# Patient Record
Sex: Female | Born: 1990
Health system: Southern US, Community
[De-identification: ages and names within clinical notes are randomized; demographics above are authoritative.]

## PROBLEM LIST (undated history)

## (undated) ENCOUNTER — Inpatient Hospital Stay (HOSPITAL_COMMUNITY): Payer: Self-pay

## (undated) DIAGNOSIS — K831 Obstruction of bile duct: Secondary | ICD-10-CM

## (undated) DIAGNOSIS — O24419 Gestational diabetes mellitus in pregnancy, unspecified control: Secondary | ICD-10-CM

## (undated) DIAGNOSIS — I8390 Asymptomatic varicose veins of unspecified lower extremity: Secondary | ICD-10-CM

## (undated) DIAGNOSIS — O0990 Supervision of high risk pregnancy, unspecified, unspecified trimester: Secondary | ICD-10-CM

## (undated) DIAGNOSIS — F329 Major depressive disorder, single episode, unspecified: Secondary | ICD-10-CM

## (undated) DIAGNOSIS — K8020 Calculus of gallbladder without cholecystitis without obstruction: Secondary | ICD-10-CM

## (undated) DIAGNOSIS — O26613 Liver and biliary tract disorders in pregnancy, third trimester: Secondary | ICD-10-CM

## (undated) DIAGNOSIS — F32A Depression, unspecified: Secondary | ICD-10-CM

## (undated) DIAGNOSIS — Z8619 Personal history of other infectious and parasitic diseases: Secondary | ICD-10-CM

## (undated) DIAGNOSIS — O210 Mild hyperemesis gravidarum: Secondary | ICD-10-CM

## (undated) DIAGNOSIS — E669 Obesity, unspecified: Secondary | ICD-10-CM

## (undated) DIAGNOSIS — E66811 Obesity, class 1: Secondary | ICD-10-CM

## (undated) HISTORY — DX: Major depressive disorder, single episode, unspecified: F32.9

## (undated) HISTORY — DX: Depression, unspecified: F32.A

## (undated) HISTORY — DX: Mild hyperemesis gravidarum: O21.0

## (undated) HISTORY — DX: Supervision of high risk pregnancy, unspecified, unspecified trimester: O09.90

## (undated) HISTORY — DX: Personal history of other infectious and parasitic diseases: Z86.19

## (undated) HISTORY — DX: Gestational diabetes mellitus in pregnancy, unspecified control: O24.419

## (undated) HISTORY — DX: Obesity, unspecified: E66.9

## (undated) HISTORY — DX: Liver and biliary tract disorders in pregnancy, third trimester: O26.613

## (undated) HISTORY — DX: Obesity, class 1: E66.811

## (undated) HISTORY — DX: Obstruction of bile duct: K83.1

## (undated) HISTORY — DX: Liver and biliary tract disorders in pregnancy, third trimester: K80.20

## (undated) HISTORY — PX: NO PAST SURGERIES: SHX2092

## (undated) HISTORY — DX: Asymptomatic varicose veins of unspecified lower extremity: I83.90

---
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
## 2009-03-12 ENCOUNTER — Inpatient Hospital Stay (HOSPITAL_COMMUNITY): Admission: AD | Admit: 2009-03-12 | Discharge: 2009-03-14 | Payer: Self-pay | Admitting: Obstetrics & Gynecology

## 2009-03-12 ENCOUNTER — Ambulatory Visit: Payer: Self-pay | Admitting: Physician Assistant

## 2010-10-09 LAB — CBC
HCT: 38 % (ref 36.0–46.0)
Hemoglobin: 12.8 g/dL (ref 12.0–15.0)
RBC: 4.27 MIL/uL (ref 3.87–5.11)
RDW: 14.7 % (ref 11.5–15.5)

## 2010-10-15 LAB — URINALYSIS, ROUTINE W REFLEX MICROSCOPIC
Glucose, UA: >1000 mg/dL — AB
Leukocytes, UA: NEGATIVE
Nitrite: NEGATIVE
Specific Gravity, Urine: 1.033 — ABNORMAL HIGH (ref 1.005–1.030)
pH: 6 (ref 5.0–8.0)

## 2010-10-15 LAB — URINE MICROSCOPIC-ADD ON

## 2010-10-15 LAB — POCT I-STAT, CHEM 8
BUN: <3 mg/dL — ABNORMAL LOW (ref 6–23)
Calcium, Ion: 1.2 mmol/L (ref 1.12–1.32)
Chloride: 101 mEq/L (ref 96–112)
Creatinine, Ser: 0.4 mg/dL (ref 0.4–1.2)
Glucose, Bld: 70 mg/dL (ref 70–99)
TCO2: 24 mmol/L (ref 0–100)

## 2010-10-15 LAB — WET PREP, GENITAL: Yeast Wet Prep HPF POC: NONE SEEN

## 2010-10-15 LAB — ABO/RH: ABO/RH(D): A POS

## 2010-10-15 LAB — GC/CHLAMYDIA PROBE AMP, GENITAL
Chlamydia, DNA Probe: NEGATIVE
GC Probe Amp, Genital: NEGATIVE

## 2010-10-15 LAB — POCT PREGNANCY, URINE: Preg Test, Ur: POSITIVE

## 2012-07-05 NOTE — L&D Delivery Note (Signed)
Delivery Note At 7:15 PM a viable female was delivered via Vaginal, Spontaneous Delivery.  Apgars pending.     Placenta status: Intact, Spontaneous.  Cord: 3 vessels with the following complications: None.    Anesthesia: None  Episiotomy: None Lacerations: 1st degree;Labial Suture Repair: None Est. Blood Loss (mL): 150  Mom to postpartum.  Baby to nursery-stable.  [facility] 12/25/2012, 8:05 PM

## 2012-07-17 LAB — OB RESULTS CONSOLE RUBELLA ANTIBODY, IGM: Rubella: IMMUNE

## 2012-07-17 LAB — OB RESULTS CONSOLE ANTIBODY SCREEN: Antibody Screen: NEGATIVE

## 2012-07-17 LAB — OB RESULTS CONSOLE ABO/RH

## 2012-07-17 LAB — OB RESULTS CONSOLE GC/CHLAMYDIA: Gonorrhea: NEGATIVE

## 2012-08-14 ENCOUNTER — Other Ambulatory Visit (HOSPITAL_COMMUNITY): Payer: Self-pay | Admitting: Nurse Practitioner

## 2012-08-14 DIAGNOSIS — Z3689 Encounter for other specified antenatal screening: Secondary | ICD-10-CM

## 2012-08-17 ENCOUNTER — Ambulatory Visit (HOSPITAL_COMMUNITY)
Admission: RE | Admit: 2012-08-17 | Discharge: 2012-08-17 | Disposition: A | Discharge status: Home / Self Care | Payer: Medicaid Other | Source: Ambulatory Visit | Attending: Nurse Practitioner | Admitting: Nurse Practitioner

## 2012-08-17 ENCOUNTER — Encounter (HOSPITAL_COMMUNITY): Payer: Self-pay

## 2012-08-17 DIAGNOSIS — Z1389 Encounter for screening for other disorder: Secondary | ICD-10-CM | POA: Insufficient documentation

## 2012-08-17 DIAGNOSIS — Z363 Encounter for antenatal screening for malformations: Secondary | ICD-10-CM | POA: Insufficient documentation

## 2012-08-17 DIAGNOSIS — Z3689 Encounter for other specified antenatal screening: Secondary | ICD-10-CM

## 2012-08-17 DIAGNOSIS — O358XX0 Maternal care for other (suspected) fetal abnormality and damage, not applicable or unspecified: Secondary | ICD-10-CM | POA: Insufficient documentation

## 2012-12-15 ENCOUNTER — Other Ambulatory Visit (HOSPITAL_COMMUNITY): Payer: Self-pay | Admitting: Physician Assistant

## 2012-12-15 ENCOUNTER — Ambulatory Visit (HOSPITAL_COMMUNITY)
Admission: RE | Admit: 2012-12-15 | Discharge: 2012-12-15 | Disposition: A | Discharge status: Home / Self Care | Payer: Medicaid Other | Source: Ambulatory Visit | Attending: Physician Assistant | Admitting: Physician Assistant

## 2012-12-15 DIAGNOSIS — K831 Obstruction of bile duct: Secondary | ICD-10-CM

## 2012-12-15 DIAGNOSIS — K838 Other specified diseases of biliary tract: Secondary | ICD-10-CM | POA: Insufficient documentation

## 2012-12-15 DIAGNOSIS — O99891 Other specified diseases and conditions complicating pregnancy: Secondary | ICD-10-CM | POA: Insufficient documentation

## 2012-12-15 DIAGNOSIS — Z3689 Encounter for other specified antenatal screening: Secondary | ICD-10-CM | POA: Insufficient documentation

## 2012-12-18 ENCOUNTER — Encounter (HOSPITAL_COMMUNITY): Payer: Self-pay | Admitting: *Deleted

## 2012-12-18 ENCOUNTER — Telehealth (HOSPITAL_COMMUNITY): Payer: Self-pay | Admitting: *Deleted

## 2012-12-18 NOTE — Telephone Encounter (Signed)
Preadmission screen Interpreter number [id number] 

## 2012-12-20 LAB — OB RESULTS CONSOLE GBS: GBS: POSITIVE

## 2012-12-25 ENCOUNTER — Encounter (HOSPITAL_COMMUNITY): Payer: Self-pay

## 2012-12-25 ENCOUNTER — Encounter (HOSPITAL_COMMUNITY): Payer: Self-pay | Admitting: Anesthesiology

## 2012-12-25 ENCOUNTER — Inpatient Hospital Stay (HOSPITAL_COMMUNITY): Payer: Medicaid Other

## 2012-12-25 ENCOUNTER — Inpatient Hospital Stay (HOSPITAL_COMMUNITY)
Admission: RE | Admit: 2012-12-25 | Discharge: 2012-12-27 | DRG: 775 | Disposition: A | Discharge status: Home / Self Care | Payer: Medicaid Other | Source: Ambulatory Visit | Attending: Family Medicine | Admitting: Family Medicine

## 2012-12-25 VITALS — BP 97/63 | HR 57 | Temp 97.7°F | Resp 18 | Ht 60.0 in | Wt 162.0 lb

## 2012-12-25 DIAGNOSIS — O26619 Liver and biliary tract disorders in pregnancy, unspecified trimester: Principal | ICD-10-CM | POA: Diagnosis present

## 2012-12-25 DIAGNOSIS — E669 Obesity, unspecified: Secondary | ICD-10-CM | POA: Diagnosis present

## 2012-12-25 DIAGNOSIS — O99892 Other specified diseases and conditions complicating childbirth: Secondary | ICD-10-CM | POA: Diagnosis present

## 2012-12-25 DIAGNOSIS — O99214 Obesity complicating childbirth: Secondary | ICD-10-CM

## 2012-12-25 DIAGNOSIS — K838 Other specified diseases of biliary tract: Secondary | ICD-10-CM

## 2012-12-25 DIAGNOSIS — O26649 Intrahepatic cholestasis of pregnancy, unspecified trimester: Secondary | ICD-10-CM

## 2012-12-25 DIAGNOSIS — K831 Obstruction of bile duct: Secondary | ICD-10-CM

## 2012-12-25 DIAGNOSIS — Z22330 Carrier of Group B streptococcus: Secondary | ICD-10-CM

## 2012-12-25 LAB — CBC
HCT: 33.8 % — ABNORMAL LOW (ref 36.0–46.0)
MCV: 84.3 fL (ref 78.0–100.0)
Platelets: 317 10*3/uL (ref 150–400)
RBC: 4.01 MIL/uL (ref 3.87–5.11)
WBC: 10.4 10*3/uL (ref 4.0–10.5)

## 2012-12-25 LAB — TYPE AND SCREEN

## 2012-12-25 MED ORDER — ONDANSETRON HCL 4 MG/2ML IJ SOLN: 4.0000 mg | INTRAMUSCULAR | Status: DC | PRN

## 2012-12-25 MED ORDER — IBUPROFEN 600 MG PO TABS
600.0000 mg | ORAL_TABLET | Freq: Four times a day (QID) | ORAL | Status: DC
  Administered 2012-12-25 – 2012-12-27 (×7): 600 mg via ORAL
  Filled 2012-12-25 (×9): qty 1

## 2012-12-25 MED ORDER — PENICILLIN G POTASSIUM 5000000 UNITS IJ SOLR
5.0000 10*6.[IU] | Freq: Once | INTRAVENOUS | Status: AC
  Administered 2012-12-25: 5 10*6.[IU] via INTRAVENOUS
  Filled 2012-12-25: qty 5

## 2012-12-25 MED ORDER — SIMETHICONE 80 MG PO CHEW: 80.0000 mg | CHEWABLE_TABLET | ORAL | Status: DC | PRN

## 2012-12-25 MED ORDER — ZOLPIDEM TARTRATE 5 MG PO TABS: 5.0000 mg | ORAL_TABLET | Freq: Every evening | ORAL | Status: DC | PRN

## 2012-12-25 MED ORDER — LACTATED RINGERS IV SOLN: INTRAVENOUS | Status: DC

## 2012-12-25 MED ORDER — WITCH HAZEL-GLYCERIN EX PADS: 1.0000 "application " | MEDICATED_PAD | CUTANEOUS | Status: DC | PRN

## 2012-12-25 MED ORDER — PRENATAL MULTIVITAMIN CH
1.0000 | ORAL_TABLET | Freq: Every day | ORAL | Status: DC
  Administered 2012-12-26 – 2012-12-27 (×2): 1 via ORAL
  Filled 2012-12-25 (×2): qty 1

## 2012-12-25 MED ORDER — TERBUTALINE SULFATE 1 MG/ML IJ SOLN: 0.2500 mg | Freq: Once | INTRAMUSCULAR | Status: DC | PRN

## 2012-12-25 MED ORDER — PENICILLIN G POTASSIUM 5000000 UNITS IJ SOLR
2.5000 10*6.[IU] | INTRAVENOUS | Status: DC
  Filled 2012-12-25 (×3): qty 2.5

## 2012-12-25 MED ORDER — LACTATED RINGERS IV SOLN: 500.0000 mL | Freq: Once | INTRAVENOUS | Status: DC

## 2012-12-25 MED ORDER — OXYTOCIN 40 UNITS IN LACTATED RINGERS INFUSION - SIMPLE MED
62.5000 mL/h | INTRAVENOUS | Status: DC
  Filled 2012-12-25: qty 1000

## 2012-12-25 MED ORDER — BENZOCAINE-MENTHOL 20-0.5 % EX AERO
1.0000 "application " | INHALATION_SPRAY | CUTANEOUS | Status: DC | PRN
  Administered 2012-12-26: 1 via TOPICAL
  Filled 2012-12-25: qty 56

## 2012-12-25 MED ORDER — PHENYLEPHRINE 40 MCG/ML (10ML) SYRINGE FOR IV PUSH (FOR BLOOD PRESSURE SUPPORT)
80.0000 ug | PREFILLED_SYRINGE | INTRAVENOUS | Status: DC | PRN
  Filled 2012-12-25: qty 2

## 2012-12-25 MED ORDER — IBUPROFEN 600 MG PO TABS: 600.0000 mg | ORAL_TABLET | Freq: Four times a day (QID) | ORAL | Status: DC | PRN

## 2012-12-25 MED ORDER — SENNOSIDES-DOCUSATE SODIUM 8.6-50 MG PO TABS
2.0000 | ORAL_TABLET | Freq: Every day | ORAL | Status: DC
  Administered 2012-12-25 – 2012-12-26 (×2): 2 via ORAL

## 2012-12-25 MED ORDER — OXYCODONE-ACETAMINOPHEN 5-325 MG PO TABS: 1.0000 | ORAL_TABLET | ORAL | Status: DC | PRN

## 2012-12-25 MED ORDER — ACETAMINOPHEN 325 MG PO TABS: 650.0000 mg | ORAL_TABLET | ORAL | Status: DC | PRN

## 2012-12-25 MED ORDER — DIBUCAINE 1 % RE OINT: 1.0000 "application " | TOPICAL_OINTMENT | RECTAL | Status: DC | PRN

## 2012-12-25 MED ORDER — OXYTOCIN BOLUS FROM INFUSION
500.0000 mL | INTRAVENOUS | Status: DC
  Administered 2012-12-25: 500 mL via INTRAVENOUS

## 2012-12-25 MED ORDER — PHENYLEPHRINE 40 MCG/ML (10ML) SYRINGE FOR IV PUSH (FOR BLOOD PRESSURE SUPPORT)
80.0000 ug | PREFILLED_SYRINGE | INTRAVENOUS | Status: DC | PRN
  Filled 2012-12-25: qty 5
  Filled 2012-12-25: qty 2

## 2012-12-25 MED ORDER — MISOPROSTOL 25 MCG QUARTER TABLET
25.0000 ug | ORAL_TABLET | ORAL | Status: DC
  Administered 2012-12-25: 25 ug via VAGINAL
  Filled 2012-12-25 (×4): qty 1
  Filled 2012-12-25 (×2): qty 0.25

## 2012-12-25 MED ORDER — EPHEDRINE 5 MG/ML INJ
10.0000 mg | INTRAVENOUS | Status: DC | PRN
  Filled 2012-12-25: qty 2

## 2012-12-25 MED ORDER — ONDANSETRON HCL 4 MG PO TABS: 4.0000 mg | ORAL_TABLET | ORAL | Status: DC | PRN

## 2012-12-25 MED ORDER — PENICILLIN G POTASSIUM 5000000 UNITS IJ SOLR
5.0000 10*6.[IU] | Freq: Once | INTRAVENOUS | Status: DC
  Filled 2012-12-25: qty 5

## 2012-12-25 MED ORDER — FENTANYL 2.5 MCG/ML BUPIVACAINE 1/10 % EPIDURAL INFUSION (WH - ANES)
14.0000 mL/h | INTRAMUSCULAR | Status: DC | PRN
  Filled 2012-12-25: qty 125

## 2012-12-25 MED ORDER — DIPHENHYDRAMINE HCL 50 MG/ML IJ SOLN: 12.5000 mg | INTRAMUSCULAR | Status: DC | PRN

## 2012-12-25 MED ORDER — ONDANSETRON HCL 4 MG/2ML IJ SOLN: 4.0000 mg | Freq: Four times a day (QID) | INTRAMUSCULAR | Status: DC | PRN

## 2012-12-25 MED ORDER — LIDOCAINE HCL (PF) 1 % IJ SOLN
30.0000 mL | INTRAMUSCULAR | Status: DC | PRN
  Filled 2012-12-25 (×2): qty 30

## 2012-12-25 MED ORDER — PENICILLIN G POTASSIUM 5000000 UNITS IJ SOLR
2.5000 10*6.[IU] | INTRAMUSCULAR | Status: DC
  Filled 2012-12-25 (×4): qty 2.5

## 2012-12-25 MED ORDER — FLEET ENEMA 7-19 GM/118ML RE ENEM: 1.0000 | ENEMA | RECTAL | Status: DC | PRN

## 2012-12-25 MED ORDER — LANOLIN HYDROUS EX OINT: TOPICAL_OINTMENT | CUTANEOUS | Status: DC | PRN

## 2012-12-25 MED ORDER — LACTATED RINGERS IV SOLN
500.0000 mL | INTRAVENOUS | Status: DC | PRN
  Administered 2012-12-25: 1000 mL via INTRAVENOUS

## 2012-12-25 MED ORDER — DIPHENHYDRAMINE HCL 25 MG PO CAPS
25.0000 mg | ORAL_CAPSULE | Freq: Four times a day (QID) | ORAL | Status: DC | PRN
  Administered 2012-12-27: 25 mg via ORAL
  Filled 2012-12-25: qty 1

## 2012-12-25 MED ORDER — EPHEDRINE 5 MG/ML INJ
10.0000 mg | INTRAVENOUS | Status: DC | PRN
  Filled 2012-12-25: qty 4
  Filled 2012-12-25: qty 2

## 2012-12-25 MED ORDER — CITRIC ACID-SODIUM CITRATE 334-500 MG/5ML PO SOLN: 30.0000 mL | ORAL | Status: DC | PRN

## 2012-12-25 NOTE — Progress Notes (Signed)
Assisted FP with explanation of plan of care.

## 2012-12-25 NOTE — Anesthesia Preprocedure Evaluation (Signed)
Anesthesia Evaluation  Patient identified by MRN, date of birth, ID band Patient awake    Reviewed: Allergy & Precautions, H&P , Patient's Chart, lab work & pertinent test results  Airway Mallampati: II TM Distance: >3 FB Neck ROM: full    Dental no notable dental hx. (+) Teeth Intact   Pulmonary neg pulmonary ROS,  breath sounds clear to auscultation  Pulmonary exam normal       Cardiovascular negative cardio ROS  Rhythm:regular Rate:Normal     Neuro/Psych negative neurological ROS  negative psych ROS   GI/Hepatic negative GI ROS, Neg liver ROS, Cholestasis of pregnancy- no labs in system   Endo/Other  negative endocrine ROSObesity  Renal/GU negative Renal ROS  negative genitourinary   Musculoskeletal   Abdominal Normal abdominal exam  (+)   Peds  Hematology negative hematology ROS (+)   Anesthesia Other Findings Speaks Spanish  Reproductive/Obstetrics (+) Pregnancy                           Anesthesia Physical Anesthesia Plan  ASA: II  Anesthesia Plan: Epidural   Post-op Pain Management:    Induction:   Airway Management Planned:   Additional Equipment:   Intra-op Plan:   Post-operative Plan:   Informed Consent: I have reviewed the patients History and Physical, chart, labs and discussed the procedure including the risks, benefits and alternatives for the proposed anesthesia with the patient or authorized representative who has indicated his/her understanding and acceptance.     Plan Discussed with: Anesthesiologist  Anesthesia Plan Comments:         Anesthesia Quick Evaluation

## 2012-12-25 NOTE — H&P (Signed)
I examined pt and agree with documentation above and resident plan of care. [name],Colleen Coleman  

## 2012-12-25 NOTE — Progress Notes (Signed)
Colleen Coleman is a 22 y.o. G2P1001 at [redacted]w[redacted]d who presents for IOL secondary to cholestasis of pregnancy.   Subjective: Patient reports pain with contractions although manageable currently.  Patient currently on 2nd dose of Cytotec.    Objective: BP 108/64   Pulse 88   Temp(Src) 98 F (36.7 C) (Oral)   Resp 16   Ht 5' (1.524 m)   Wt 73.483 kg (162 lb)   BMI 31.64 kg/m2   LMP 04/11/2012     FHT:  FHR: 150 bpm, variability: moderate,  accelerations:  Present,  decelerations:  Absent UC:   regular, every 2-3 minutes SVE:   Dilation: 3 Effacement (%): 50 Station: -2 Exam by:: lee  Labs: Lab Results  Component Value Date   WBC 10.4 12/25/2012   HGB 11.4* 12/25/2012   HCT 33.8* 12/25/2012   MCV 84.3 12/25/2012   PLT 317 12/25/2012    Assessment / Plan: Induction of labor due to cholestasis of pregnancy with second dose of Cytotec in place.   Labor: Progressing normally Preeclampsia:  None Fetal Wellbeing:  Category I Pain Control:  Labor support without medications I/D:  GBS positive.  Plan to start Penicillin when in active labor. Anticipated MOD:  NSVD  Harvie Heck 12/25/2012, 6:13 PM

## 2012-12-25 NOTE — H&P (Signed)
Colleen Coleman is a 22 y.o. female G2P1001 at 37 weeks and 0 days presenting for IOL secondary to cholestasis of pregnancy.  Patient states that she has been on a medication for the cholestasis but is unable to remember the name.  The patient denies any other problems with her pregnancy.  Patient denies any vaginal bleeding, vaginal discharge, or fluid leakage.  She reports good fetal movement.  She is accompanied by her husband and sister-in-law.  History OB History   Grav Para Term Preterm Abortions TAB SAB Ect Mult Living   2 1 1       1      Past Medical History  Diagnosis Date   Hx of chlamydia infection    Varicose veins     back of both legs   Cholelithiasis complicating pregnancy in third trimester, antepartum    Hyperemesis arising during pregnancy    Depression    Obesity (BMI 30.0-34.9)    Past Surgical History  Procedure Laterality Date   No past surgeries     Family History: family history includes Depression in her sister; Hypertension in her sister; and Other in her mother and sister. Social History:  reports that she has never smoked. She has never used smokeless tobacco. She reports that she does not drink alcohol or use illicit drugs.  Review of Systems  Constitutional: Negative.   Respiratory: Negative.   Cardiovascular: Negative.   Gastrointestinal: Negative.   Genitourinary: Negative.   Musculoskeletal: Negative.   Skin: Negative.    Blood pressure 118/74, pulse 110, temperature 98.5 F (36.9 C), resp. rate 18, height 5' (1.524 m), weight 73.483 kg (162 lb), last menstrual period 04/11/2012.  Exam Physical Exam  Constitutional: She appears well-developed and well-nourished.  HENT:  Head: Normocephalic and atraumatic.  Neck: Neck supple.  Cardiovascular: Normal rate, regular rhythm and normal heart sounds.   Respiratory: Effort normal and breath sounds normal.  Genitourinary: Vagina normal.  FHT-130 bpm, moderate variability, + accels,  no decels present  UC-irregular, occuring occasionally   Cervix - fingertip/0/-3, posterior    Prenatal labs: ABO, Rh: A/Positive/-- (01/13 0000) Antibody: Negative (01/13 0000) Rubella: Immune (01/13 0000) RPR: Nonreactive (01/13 0000)  HBsAg: Negative (01/13 0000)  HIV: Non-reactive (01/13 0000)  GBS: Positive (06/18 0756)   Assessment/Plan: 22 y.o. female G2P1001 at 37 weeks and 0 days presenting for IOL secondary to cholestasis of pregnancy.  -Admit to [facility]. -Patient posterior and fingertip on cervical exam.  Rather difficult to access presentation.  Will obtain bedside ultrasound to confirm vertex presentation.  Assuming vertex, will proceed with IOL with placement of Cytotec.  -Expectant management. -GBS positive.  Will start Penicillin when in active labor.   [facility] 12/25/2012, 10:16 AM

## 2012-12-25 NOTE — Progress Notes (Signed)
Megan Mans RN with questions

## 2012-12-26 NOTE — Progress Notes (Signed)
UR chart review completed.  

## 2012-12-26 NOTE — Progress Notes (Signed)
Post Partum Day 1 Subjective: no complaints; some abdominal cramping, controlled by Ibuprofen. Minimal vaginal bleeding. Pt is not planning on using contraception.  Spanish interpreter, Eda, at bedside.  Objective: Blood pressure 109/75, pulse 64, temperature 97.9 F (36.6 C), temperature source Oral, resp. rate 17, height 5' (1.524 m), weight 73.483 kg (162 lb), last menstrual period 04/11/2012, unknown if currently breastfeeding.  Physical Exam:  General: alert, cooperative and no distress Lochia: appropriate Uterine Fundus: firm Incision: NA DVT Evaluation: No evidence of DVT seen on physical exam. Negative Homan's sign. No cords or calf tenderness. No significant calf/ankle edema.   Recent Labs  12/25/12 0845  HGB 11.4*  HCT 33.8*    Assessment/Plan: Plan for discharge tomorrow, Breastfeeding and Lactation consult   LOS: 1 day   Wendell Nicoson 12/26/2012, 8:46 AM
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
## 2012-12-27 MED ORDER — IBUPROFEN 600 MG PO TABS: 600.0000 mg | ORAL_TABLET | Freq: Four times a day (QID) | ORAL | Status: DC

## 2012-12-27 NOTE — Discharge Summary (Signed)
Attestation of Attending Supervision of Advanced Practitioner (PA/CNM/NP): Evaluation and management procedures were performed by the Advanced Practitioner under my supervision and collaboration.  I have reviewed the Advanced Practitioner's note and chart, and I agree with the management and plan.  Eldred Sooy, MD, FACOG Attending Obstetrician & Gynecologist Faculty Practice, [facility]  

## 2012-12-27 NOTE — Progress Notes (Signed)
Post Partum Day 2 Subjective: up ad lib, voiding, tolerating PO and + flatus; minimal vaginal bleeding and abdominal cramping, well-controlled by ibuprofen. Complains of some itching, relieved by benadryl. Minor dizziness yesterday, none since then. Breastfeeding without difficulties. Does not want to use birth control.  Spanish interpreter, Eda, at bedside.  Objective: Blood pressure 97/63, pulse 57, temperature 97.7 F (36.5 C), temperature source Oral, resp. rate 18, height 5' (1.524 m), weight 73.483 kg (162 lb), last menstrual period 04/11/2012, unknown if currently breastfeeding.  Physical Exam:  General: alert, cooperative and no distress Lochia: appropriate Uterine Fundus: firm Incision: NA DVT Evaluation: No evidence of DVT seen on physical exam. Negative Homan's sign. No cords or calf tenderness. No significant calf/ankle edema.   Recent Labs  12/25/12 0845  HGB 11.4*  HCT 33.8*    Assessment/Plan: Breastfeeding; Discharge tonight after 1915 at completion of 48 hour observation of newborn   LOS: 2 days   Colleen Coleman 12/27/2012, 7:39 AM

## 2012-12-27 NOTE — Lactation Note (Signed)
This note was copied from the chart of Colleen [place]. Lactation Consultation Note  Patient Name: Colleen Coleman [id number] Date: 12/27/2012 Reason for consult: Follow-up assessment   Maternal Data    Feeding    LATCH Score/Interventions       Type of Nipple: Everted at rest and after stimulation     Problem noted: Mild/Moderate discomfort Interventions (Mild/moderate discomfort): Comfort gels        Lactation Tools Discussed/Used     Consult Status Consult Status: Complete  Went in with Eda. Mom reports left nipple is sore. Reports that baby is latching well and it only hurts for the first few minutes. Small crack noted on upper area at base of nipple. Comfort gels given with instructions. Mom put them on now and reports they feel good. No questions at present, To call prn.  Colleen Coleman 12/27/2012, 11:29 AM

## 2012-12-27 NOTE — Discharge Summary (Signed)
Obstetric Discharge Summary Reason for Admission: induction of labor for cholestasis Prenatal Procedures: none Intrapartum Procedures: spontaneous vaginal delivery and GBS prophylaxis Postpartum Procedures: none Complications-Operative and Postpartum: 1st degree perineal laceration Hemoglobin  Date Value Range Status  12/25/2012 11.4* 12.0 - 15.0 g/dL Final     HCT  Date Value Range Status  12/25/2012 33.8* 36.0 - 46.0 % Final    Physical Exam:  General: alert, cooperative and no distress Lochia: appropriate Uterine Fundus: firm Incision: NA DVT Evaluation: No evidence of DVT seen on physical exam. Negative Homan's sign. No cords or calf tenderness. No significant calf/ankle edema.  Discharge Diagnoses: Term Pregnancy-delivered; cholestasis of pregnancy  Discharge Information: Date: 12/27/2012 Activity: unrestricted Diet: routine Medications: PNV and Ibuprofen Condition: stable Instructions: refer to practice specific booklet Discharge to: home   Newborn Data: Live born female  Birth Weight: 6 lb 0.8 oz (2745 g) APGAR: 8, 9  Home with mother.  Ardis Hughs 12/27/2012, 7:37 AM  I have seen and examined this patient and I agree with the above. Langlois, KIMBERLY 8:04 AM 12/27/2012

## 2012-12-27 NOTE — Progress Notes (Signed)
I have seen and examined this patient and I agree with the above. Cam Hai 8:01 AM 12/27/2012

## 2012-12-27 NOTE — Progress Notes (Signed)
I assisted FP with discharge instructions.

## 2012-12-28 NOTE — Progress Notes (Signed)
I examined pt and agree with documentation above and PA-S plan of care. [name],Cartez Mogle  

## 2013-07-11 ENCOUNTER — Ambulatory Visit: Payer: Medicaid Other

## 2014-05-06 ENCOUNTER — Encounter (HOSPITAL_COMMUNITY): Payer: Self-pay

## 2015-02-26 ENCOUNTER — Ambulatory Visit: Payer: Self-pay | Attending: Internal Medicine

## 2015-07-06 NOTE — L&D Delivery Note (Signed)
Delivery Note At 12:41 AM a viable female was delivered via Vaginal, Spontaneous Delivery (Presentation: ROA ).  APGAR:pending weight  .   Placenta status: 3 vessel cord; intact  Cord:  with the following complications: none.  Cord pH: n/a  Anesthesia:  none Episiotomy:  none Lacerations:  none Suture Repair: n/a Est. Blood Loss (mL):  200cc  Mom to postpartum.  Baby to Couplet care / Skin to Skin.  HARRAWAY-SMITH, Layson Bertsch 02/20/2016, 1:07 AM

## 2015-09-26 ENCOUNTER — Other Ambulatory Visit: Payer: Self-pay

## 2015-09-26 ENCOUNTER — Encounter (HOSPITAL_COMMUNITY): Payer: Self-pay | Admitting: Certified Nurse Midwife

## 2015-09-26 ENCOUNTER — Inpatient Hospital Stay (HOSPITAL_COMMUNITY)
Admission: AD | Admit: 2015-09-26 | Discharge: 2015-09-26 | Disposition: A | Discharge status: Home / Self Care | Payer: Medicaid Other | Source: Ambulatory Visit | Attending: Obstetrics & Gynecology | Admitting: Obstetrics & Gynecology

## 2015-09-26 DIAGNOSIS — R002 Palpitations: Secondary | ICD-10-CM | POA: Insufficient documentation

## 2015-09-26 DIAGNOSIS — R51 Headache: Secondary | ICD-10-CM

## 2015-09-26 DIAGNOSIS — Z3A19 19 weeks gestation of pregnancy: Secondary | ICD-10-CM | POA: Insufficient documentation

## 2015-09-26 DIAGNOSIS — E860 Dehydration: Secondary | ICD-10-CM | POA: Insufficient documentation

## 2015-09-26 DIAGNOSIS — O26892 Other specified pregnancy related conditions, second trimester: Secondary | ICD-10-CM

## 2015-09-26 DIAGNOSIS — R9431 Abnormal electrocardiogram [ECG] [EKG]: Secondary | ICD-10-CM | POA: Insufficient documentation

## 2015-09-26 DIAGNOSIS — M5431 Sciatica, right side: Secondary | ICD-10-CM | POA: Insufficient documentation

## 2015-09-26 LAB — COMPREHENSIVE METABOLIC PANEL
ALT: 27 U/L (ref 14–54)
AST: 25 U/L (ref 15–41)
Albumin: 3.3 g/dL — ABNORMAL LOW (ref 3.5–5.0)
Alkaline Phosphatase: 77 U/L (ref 38–126)
Anion gap: 8 (ref 5–15)
BUN: 6 mg/dL (ref 6–20)
CHLORIDE: 104 mmol/L (ref 101–111)
CO2: 26 mmol/L (ref 22–32)
CREATININE: 0.4 mg/dL — AB (ref 0.44–1.00)
Calcium: 8.4 mg/dL — ABNORMAL LOW (ref 8.9–10.3)
GFR calc non Af Amer: >60 mL/min (ref >60)
Glucose, Bld: 123 mg/dL — ABNORMAL HIGH (ref 65–99)
Potassium: 3.4 mmol/L — ABNORMAL LOW (ref 3.5–5.1)
SODIUM: 138 mmol/L (ref 135–145)
Total Bilirubin: 0.1 mg/dL — ABNORMAL LOW (ref 0.3–1.2)
Total Protein: 7.1 g/dL (ref 6.5–8.1)

## 2015-09-26 LAB — CBC
HCT: 33.8 % — ABNORMAL LOW (ref 36.0–46.0)
Hemoglobin: 11.9 g/dL — ABNORMAL LOW (ref 12.0–15.0)
MCH: 30.1 pg (ref 26.0–34.0)
MCHC: 35.2 g/dL (ref 30.0–36.0)
MCV: 85.4 fL (ref 78.0–100.0)
PLATELETS: 262 10*3/uL (ref 150–400)
RBC: 3.96 MIL/uL (ref 3.87–5.11)
RDW: 13.7 % (ref 11.5–15.5)
WBC: 10.3 10*3/uL (ref 4.0–10.5)

## 2015-09-26 LAB — URINALYSIS, ROUTINE W REFLEX MICROSCOPIC
Bilirubin Urine: NEGATIVE
KETONES UR: NEGATIVE mg/dL
Leukocytes, UA: NEGATIVE
Nitrite: NEGATIVE
PH: 5.5 (ref 5.0–8.0)
PROTEIN: NEGATIVE mg/dL
Specific Gravity, Urine: 1.025 (ref 1.005–1.030)

## 2015-09-26 LAB — TSH: TSH: 0.668 u[IU]/mL (ref 0.350–4.500)

## 2015-09-26 LAB — URINE MICROSCOPIC-ADD ON: WBC UA: NONE SEEN WBC/hpf (ref 0–5)

## 2015-09-26 NOTE — MAU Provider Note (Signed)
History     CSN: [id number]  Arrival date and time: 09/26/15 1436   First Provider Initiated Contact with Patient 09/26/15 1549      HPI Colleen Coleman is a 25 yo G3P2002 at 3564w1d who presents to Maternity Admissions for palpitations, right back and leg pain, and headaches. [facility]. Pt reports that the palpitations starts 3-4 weeks ago and occur sporadically. Pt describes the sensation of a fast heart beat and her heart feels like it's coming out of her throat. During these episodes, her head feels unusual and she endorses SOB, but she denies LOC or dizziness. The episodes resolve spontaneously. The episodes occur at rest and while ambulating, and have no reported aggravating or alleviating factors. Pt denies chest pain and is not currently having an episode during this visit.  Pt reports right leg pain that begins in her lower back and shoots down her right leg. It is worse with ambulation. The pain began 1 month ago and is intermittent. Pain is positional, worse when pt bends forward at the waist.  Pt reports headaches that began during this pregnancy and have continued intermittently. Pt has not used any medication to alleviate this pain. Pt believes that she drinks enough water.   Past Medical History  Diagnosis Date  . Hx of chlamydia infection   . Varicose veins     back of both legs  . Cholelithiasis complicating pregnancy in third trimester, antepartum   . Hyperemesis arising during pregnancy   . Depression   . Obesity (BMI 30.0-34.9)     Past Surgical History  Procedure Laterality Date  . No past surgeries      Family History  Problem Relation Age of Onset  . Other Mother     varicosities  . Hypertension Sister   . Other Sister     varicosities  . Depression Sister     Social History  Substance Use Topics  . Smoking status: Never Smoker   . Smokeless tobacco: Never Used  . Alcohol Use: No     Allergies:  Allergies  Allergen Reactions  . Shrimp [Shellfish Allergy] Hives    Prescriptions prior to admission  Medication Sig Dispense Refill Last Dose  . Prenatal Vit-Fe Fumarate-FA (PRENATAL MULTIVITAMIN) TABS tablet Take 1 tablet by mouth daily at 12 noon.   09/26/2015 at Unknown time    Review of Systems  Eyes: Negative for blurred vision and double vision.  Respiratory: Positive for shortness of breath.   Cardiovascular: Positive for palpitations. Negative for chest pain.  Gastrointestinal: Negative for diarrhea and constipation.  Neurological: Positive for headaches. Negative for dizziness.   Physical Exam   Blood pressure 113/52, pulse 79, temperature 97.8 F (36.6 C), temperature source Oral, resp. rate 18, height 5\' 4"  (1.626 m), weight 153 lb (69.4 kg), unknown if currently breastfeeding.  Patient Vitals for the past 24 hrs:  BP Temp Temp src Pulse Resp Height Weight  09/26/15 1452 (!) 113/52 mmHg 97.8 F (36.6 C) Oral 79 18 5\' 4"  (1.626 m) 153 lb (69.4 kg)   Orthostatic VS done but not appearing in electronic record.  Supine, pulse below 50 for 1 minute, then 61, pt asymptomatic, BP 105/50 Sitting, pulse 79, BP 104/54 Standing, pulse 95, BP 106/72    Physical Exam  Constitutional: She is oriented to person, place, and time. She appears well-developed and well-nourished. No distress.  HENT:  Head: Normocephalic and atraumatic.  Eyes: Conjunctivae are normal.  Cardiovascular: Normal rate.  Exam reveals no gallop and no friction rub.   No murmur heard. Heart rhythm was irregular, without discernable pattern.  Respiratory: Effort normal and breath sounds normal. No respiratory distress. She has no wheezes. She has no rales.  GI: Soft. Bowel sounds are normal. She exhibits distension (early gravid uterus, at umbilicus). There is no tenderness. There is no rebound and no guarding.  Neurological: She is alert and oriented to person, place, and time.  Skin:  Skin is warm and dry. She is not diaphoretic.  Patient appeared flushed    MAU Course  Procedures  MDM Pt reported episodic palpitations concerning for arrhythmia. Etiology of the arrhythmia may include dehydration, palpitations of pregnancy, and undiagnosed structural/electrophysiological arrhythmia. ECG revealed irregularly irregular rhythm with atrial contractions intact. Leg pain is consistent with sciatica of pregnancy, unlikely to be UTI given negative UA. HA onset during pregnancy is likely dehydration mediated, but could also be undiagnosed migraines.  Assessment and Plan   1. Nonspecific abnormal electrocardiogram (ECG) (EKG)   2. Intermittent palpitations   3. Headache in pregnancy, antepartum, second trimester   4. Mild dehydration   5. Sciatica of right side     Plan:  D/C home Cardiology to contact pt next week for outpatient f/u TSH today per cardiology Increase PO fluids Rest/ice/heat/Tylenol/Pregnancy support belt for sciatica Tylenol, increase PO fluids for h/a F/U at [facility] as scheduled Return to MAU as needed for emergencies  LEFTWICH-KIRBY, Alexys Lobello 09/26/2015, 4:43 PM

## 2015-09-26 NOTE — Discharge Instructions (Signed)
Palpitaciones (Palpitations) Es la sensacin de sentir que el latido cardaco es irregular o es ms rpido que lo normal. Se siente como un aleteo o que falta un latido. Generalmente no es un problema grave. Sin embargo, en algunos casos podra ser necesario hacer ms estudios diagnsticos. CAUSAS  Las causas de las palpitaciones pueden ser:  Colleen Coleman.  El consumo de cafena u otros estimulantes, como pastillas para Geophysical data processor o bebidas energizantes.  Alcohol.  Situaciones de estrs y [place].  La actividad fsica extenuante.  Fatiga.  Algunos medicamentos.  Enfermedad cardaca, especialmente si tiene antecedentes de ritmo cardaco irregular (arritmia), como fibrilacin auricular, aleteo auricular o taquicardia supraventricular.  El uso incorrecto de un marcapasos o Biochemist, clinical. DIAGNSTICO  Para hallar la causa de las palpitaciones, el mdico le har una historia clnica y un examen fsico. El mdico tambin puede hacerle un estudio llamado electrocardiograma (ECG) ambulatorio. El ECG registra el patrn de los latidos cardacos durante un perodo de 24horas. Tambin pueden hacerle otros estudios, por ejemplo:  Ecocardiograma transtorcico (ETT). Durante Management consultant, se usan ondas sonoras para evaluar cmo fluye la sangre por el corazn.  Ecocardiograma transesofgico (ETE).  Monitoreo cardaco. Este estudio permite que el mdico controle la frecuencia y el ritmo cardaco en tiempo real.  Monitor Holter. Es un dispositivo porttil que [employer] latidos cardacos y [place] a Education administrator las arritmias cardacas. Le permite al [employer] registrar la actividad cardaca durante varios das, si es necesario.  Pruebas de estrs por ejercicio o por medicamentos que aceleran los latidos cardacos. TRATAMIENTO  El tratamiento de las palpitaciones depende de la causa y puede variar mucho. En la [employer] no se requiere otro tratamiento que esperar, Lexicographer y Pharmacologist  los sntomas. Otras causas, como la fibrilacin auricular, el aleteo auricular o la taquicardia supraventricular generalmente requieren Pharmacist, community. INSTRUCCIONES PARA EL CUIDADO EN EL HOGAR   Evite:  Bebidas que contengan cafena como el caf, el t, los refrescos, las pastillas para Geophysical data processor y las bebidas energizantes.  Chocolate.  Alcohol.  Si fuma, abandone el hbito.  Reduzca los niveles de estrs y [place]. Algunas cosas que pueden ayudarlo a relajarse son:  Un mtodo para controlar el cuerpo con la mente, por ejemplo, controlar los latidos (biorregulacin).  El yoga.  La meditacin.  La actividad fsica como natacin, trote o caminatas.  Descanse y duerma lo suficiente. SOLICITE ATENCIN MDICA SI:   Contina con latidos cardacos rpidos o irregulares durante ms de 24 horas.  Las [employer] suceden con ms frecuencia. SOLICITE ATENCIN MDICA DE INMEDIATO SI:  Siente falta de aire o dolor en el pecho.  Sufre un dolor intenso de [place].  Se siente mareado o se desmaya. ASEGRESE DE QUE:  Comprende estas instrucciones.  Controlar su afeccin.  Recibir ayuda de inmediato si no mejora o si empeora.   Esta informacin no tiene Theme park manager el consejo del mdico. Asegrese de hacerle al mdico cualquier pregunta que tenga.   Document Released: 03/31/2005 Document Revised: 06/26/2013 Elsevier Interactive Patient Education 2016 [employer].   Dolor de cabeza general sin causa (General Headache Without Cause) El dolor de cabeza es un dolor o Dentist que se siente en la zona de la cabeza o del cuello. Puede no tener una causa especfica. Hay muchas causas y tipos de dolores de [place]. Los dolores de cabeza ms comunes son los siguientes:  Cefalea tensional.  Cefaleas migraosas.  Cefalea en brotes.  Cefaleas diarias crnicas. INSTRUCCIONES PARA EL CUIDADO EN EL  HOGAR  Controle su afeccin para ver si hay cambios. Siga estos pasos  para Scientist, physiologicalcontrolar la afeccin: Control del Reynolds Americandolor  Tome los medicamentos de venta libre y los recetados solamente como se lo haya indicado el mdico.  Cuando sienta dolor de cabeza acustese en un cuarto oscuro y tranquilo.  Si se lo indican, aplique hielo sobre la cabeza y la zona del cuello:  Ponga el hielo en una bolsa plstica.  Coloque una toalla entre la piel y la bolsa de hielo.  Coloque el hielo durante 20minutos, 2 a 3veces por Futures traderda.  Utilice una almohadilla trmica o tome una ducha con agua caliente para aplicar calor en la cabeza y la zona del cuello como se lo haya indicado el mdico.  Mantenga las luces tenues si le Liz Claibornemolesta las luces brillantes o sus dolores de cabeza empeoran. Comida y bebida  Mantenga un horario para las comidas.  Limite el consumo de bebidas alcohlicas.  Consuma menos cantidad de cafena o deje de tomarla. Instrucciones generales  Concurra a todas las visitas de control como se lo haya indicado el mdico. Esto es importante.  Lleve un diario de los dolores de cabeza para Financial risk analystaveriguar qu factores pueden desencadenarlos. Por ejemplo, escriba los siguientes datos:  Lo que usted come y Estate agentbebe.  Cunto tiempo duerme.  Algn cambio en su dieta o en los medicamentos.  Pruebe algunas tcnicas de relajacin, como los Farmermasajes.  Limite el estrs.  Sintese con la espalda recta y no tense los msculos.  No consuma productos que contengan tabaco, incluidos cigarrillos, tabaco de Theatre managermascar o cigarrillos electrnicos. Si necesita ayuda para dejar de fumar, consulte al mdico.  Haga actividad fsica habitualmente como se lo haya indicado el mdico.  Tenga un horario fijo para dormir. Duerma entre 7 y 9horas o la cantidad de horas que le haya recomendado el mdico. SOLICITE ATENCIN MDICA SI:   Los medicamentos no Materials engineerlogran aliviar los sntomas.  Tiene un dolor de cabeza que es diferente del dolor de cabeza habitual.  Tiene nuseas o vmitos.  Tiene  fiebre. SOLICITE ATENCIN MDICA DE INMEDIATO SI:   El dolor se hace cada vez ms intenso.  Ha vomitado repetidas veces.  Presenta rigidez en el cuello.  Sufre prdida de la visin.  Tiene problemas para hablar.  Siente dolor en el ojo o en el odo.  Presenta debilidad muscular o prdida del control muscular.  Pierde el equilibrio o tiene problemas para Advertising account plannercaminar.  Sufre mareos o se desmaya.  Se siente confundido.   Esta informacin no tiene Theme park managercomo fin reemplazar el consejo del mdico. Asegrese de hacerle al mdico cualquier pregunta que tenga.   Document Released: 03/31/2005 Document Revised: 03/12/2015 Elsevier Interactive Patient Education 2016 [employer] Engineer, agriculturalCitica  (Sciatica)  La citica es el dolor, debilidad, entumecimiento u hormigueo a lo largo del nervio citico. El nervio comienza en la zona inferior de la espalda y desciende por la parte posterior de cada pierna. El nervio controla los msculos de la parte inferior de la pierna y de la zona posterior de la rodilla, y transmite la sensibilidad a la parte posterior del muslo, la pierna y la planta del pie. La citica es un sntoma de otras afecciones mdicas. Por ejemplo, un dao a los nervios o algunas enfermedades como un disco herniado o un espoln seo en la columna vertebral, podran daarle o presionar en el nervio citico. Esto causa dolor, debilidad y otras sensaciones normalmente asociadas con la citica. Generalmente la citica afecta slo un lado  del cuerpo. CAUSAS   Disco herniado o desplazado.  Enfermedad degenerativa del disco.  Un sndrome doloroso que compromete un msculo angosto de los glteos (sndrome piriforme).  Lesin o fractura plvica.  Embarazo.  Tumor (casos raros). SNTOMAS  Los sntomas pueden variar de leves a muy graves. Por lo general, los sntomas descienden desde la zona lumbar a las nalgas y la parte posterior de la pierna. Ellos son:   Hormigueo leve o dolor sordo en la parte  inferior de la espalda, la pierna o la cadera.  Adormecimiento en la parte posterior de la pantorrilla o la planta del pie.  Sensacin de [employer] zona lumbar, la pierna o la cadera.  Dolor agudo en la zona inferior de la espalda, la pierna o la cadera.  Debilidad en las piernas.  Dolor de espalda intenso que [employer] movimientos. Los sntomas pueden empeorar al toser, Engineering geologist, rer o estar sentado o parado durante [employer]. Adems, el sobrepeso puede empeorar los sntomas.  DIAGNSTICO  Su mdico le har un examen fsico para buscar los sntomas comunes de la citica. Le pedir que haga algunos movimientos o actividades que activaran el dolor del nervio citico. Para encontrar las causas de la citica podr indicarle otros estudios. Estos pueden ser:   Anlisis de [place].  Radiografas.  Pruebas de diagnstico por imgenes, como resonancia magntica o tomografa computada. TRATAMIENTO  El tratamiento se dirige a las causas de la citica. A veces, el tratamiento no es necesario, y Chief Technology Officer y Environmental health practitioner desaparecen por s mismos. Si necesita tratamiento, su mdico puede sugerir:   Medicamentos de venta libre para Engineer, materials.  Medicamentos recetados, como antiinflamatorios, relajantes musculares o narcticos.  Aplicacin de calor o hielo en la zona del dolor.  Inyecciones de corticoides para disminuir el dolor, la irritacin y la inflamacin alrededor del nervio.  Reduccin de la [employer] perodos de [place].  Ejercicios y estiramiento del abdomen para fortalecer y Scientist, clinical (histocompatibility and immunogenetics) la flexibilidad de la columna vertebral. Su mdico puede sugerirle perder peso si el peso extra empeora el dolor de espalda.  Fisioterapia.  La ciruga para eliminar lo que presiona o pincha el nervio, como un espoln seo o parte de una hernia de disco. INSTRUCCIONES PARA EL CUIDADO EN EL HOGAR   Slo tome medicamentos de venta libre o recetados para Primary school teacher o Environmental health practitioner,  segn las indicaciones de su mdico.  Aplique hielo sobre el rea dolorida durante 20 minutos 3-4 veces por da durante los primeras 48-72 horas. Luego intente aplicar calor de la misma manera.  Haga ejercicios, elongue o realice sus actividades habituales, si no le causan ms dolor.  Cumpla con todas las sesiones de fisioterapia, segn le indique su mdico.  Cumpla con todas las visitas de control, segn le indique su mdico.  No use tacones altos o zapatos que no tengan buen apoyo.  Verifique que el colchn no sea muy blando. Un colchn firme Engineer, materials y las [place]. SOLICITE ATENCIN MDICA DE INMEDIATO SI:   Pierde el control de la vejiga o del intestino (incontinencia).  Aumenta la debilidad en la zona inferior de la espalda, la pelvis, las nalgas o las piernas.  Siente irritacin o inflamacin en la espalda.  Tiene sensacin de ardor al [employer].  El dolor empeora cuando se acuesta o lo despierta por la noche.  El dolor es peor del que experiment en el pasado.  Dura ms de 4 semanas.  Pierde peso sin motivo  de manera sbita. ASEGRESE DE QUE:   Comprende estas instrucciones.  Controlar su enfermedad.  Solicitar ayuda de inmediato si no mejora o si empeora.   Esta informacin no tiene Theme park manager el consejo del mdico. Asegrese de hacerle al mdico cualquier pregunta que tenga.   Document Released: 06/21/2005 Document Revised: 03/12/2015 Elsevier Interactive Patient Education [employer]

## 2015-09-26 NOTE — MAU Note (Signed)
Pt states she has headache on and off for several days and rates her pain a 9. Pt states she has right sided pain as well and rates it a 9/10.

## 2015-09-26 NOTE — MAU Note (Signed)
During orthostatic vital sign collection patient was lying flat and upon further assessment stated she "felt her heart was beating faster than normal" and she felt as if she was "floating". The initial pulse was below the 50 BPM range yet after adjusting the O2 monitor it went up to 61. Informed the provider of these findings.   Pts Orthostatic Vital signs:  Blood Pressure:            Pulse:.  Lying: 105/50   61  Sitting: 104/54   79 Standing: 106/72  95  Standing : 100/62 93

## 2015-09-29 ENCOUNTER — Other Ambulatory Visit (HOSPITAL_COMMUNITY): Payer: Self-pay | Admitting: Nurse Practitioner

## 2015-09-29 DIAGNOSIS — Z3689 Encounter for other specified antenatal screening: Secondary | ICD-10-CM

## 2015-09-29 LAB — OB RESULTS CONSOLE ANTIBODY SCREEN: ANTIBODY SCREEN: NEGATIVE

## 2015-09-29 LAB — OB RESULTS CONSOLE RPR: RPR: NONREACTIVE

## 2015-09-29 LAB — OB RESULTS CONSOLE PLATELET COUNT: PLATELETS: 267 10*3/uL

## 2015-09-29 LAB — OB RESULTS CONSOLE HGB/HCT, BLOOD
HEMATOCRIT: 34 %
HEMOGLOBIN: 11.9 g/dL

## 2015-09-29 LAB — OB RESULTS CONSOLE RUBELLA ANTIBODY, IGM: Rubella: IMMUNE

## 2015-09-29 LAB — OB RESULTS CONSOLE ABO/RH: RH Type: POSITIVE

## 2015-09-29 LAB — CYSTIC FIBROSIS DIAGNOSTIC STUDY: Interpretation-CFDNA:: NEGATIVE

## 2015-09-29 LAB — CYTOLOGY - PAP: Pap: NEGATIVE

## 2015-09-29 LAB — OB RESULTS CONSOLE GC/CHLAMYDIA
CHLAMYDIA, DNA PROBE: NEGATIVE
GC PROBE AMP, GENITAL: NEGATIVE

## 2015-09-29 LAB — OB RESULTS CONSOLE HIV ANTIBODY (ROUTINE TESTING): HIV: NONREACTIVE

## 2015-09-29 LAB — OB RESULTS CONSOLE HEPATITIS B SURFACE ANTIGEN: Hepatitis B Surface Ag: NEGATIVE

## 2015-09-30 ENCOUNTER — Telehealth: Payer: Self-pay | Admitting: *Deleted

## 2015-09-30 ENCOUNTER — Ambulatory Visit (HOSPITAL_COMMUNITY)
Admission: RE | Admit: 2015-09-30 | Discharge: 2015-09-30 | Disposition: A | Discharge status: Home / Self Care | Payer: Medicaid Other | Source: Ambulatory Visit | Attending: Nurse Practitioner | Admitting: Nurse Practitioner

## 2015-09-30 DIAGNOSIS — Z36 Encounter for antenatal screening of mother: Secondary | ICD-10-CM | POA: Insufficient documentation

## 2015-09-30 DIAGNOSIS — Z3A16 16 weeks gestation of pregnancy: Secondary | ICD-10-CM | POA: Diagnosis not present

## 2015-09-30 DIAGNOSIS — Z3689 Encounter for other specified antenatal screening: Secondary | ICD-10-CM

## 2015-09-30 DIAGNOSIS — R002 Palpitations: Secondary | ICD-10-CM

## 2015-09-30 NOTE — Telephone Encounter (Signed)
Message     Pt needs to be set up for 24 holter monitor And then clinic appt.     Spoke with patient who requested spanish speaking staff.

## 2015-10-01 ENCOUNTER — Encounter (HOSPITAL_COMMUNITY): Payer: Self-pay | Admitting: *Deleted

## 2015-10-01 ENCOUNTER — Other Ambulatory Visit: Payer: Self-pay

## 2015-10-01 ENCOUNTER — Inpatient Hospital Stay (HOSPITAL_COMMUNITY)
Admission: AD | Admit: 2015-10-01 | Discharge: 2015-10-01 | Disposition: A | Discharge status: Home / Self Care | Payer: Medicaid Other | Source: Ambulatory Visit | Attending: Obstetrics & Gynecology | Admitting: Obstetrics & Gynecology

## 2015-10-01 DIAGNOSIS — Z91013 Allergy to seafood: Secondary | ICD-10-CM | POA: Insufficient documentation

## 2015-10-01 DIAGNOSIS — F329 Major depressive disorder, single episode, unspecified: Secondary | ICD-10-CM | POA: Diagnosis not present

## 2015-10-01 DIAGNOSIS — O26892 Other specified pregnancy related conditions, second trimester: Secondary | ICD-10-CM | POA: Diagnosis not present

## 2015-10-01 DIAGNOSIS — R002 Palpitations: Secondary | ICD-10-CM | POA: Diagnosis present

## 2015-10-01 DIAGNOSIS — O9989 Other specified diseases and conditions complicating pregnancy, childbirth and the puerperium: Secondary | ICD-10-CM

## 2015-10-01 DIAGNOSIS — Z3A16 16 weeks gestation of pregnancy: Secondary | ICD-10-CM

## 2015-10-01 DIAGNOSIS — Z79899 Other long term (current) drug therapy: Secondary | ICD-10-CM | POA: Diagnosis not present

## 2015-10-01 LAB — URINALYSIS, ROUTINE W REFLEX MICROSCOPIC
Bilirubin Urine: NEGATIVE
Glucose, UA: NEGATIVE mg/dL
Ketones, ur: 15 mg/dL — AB
Leukocytes, UA: NEGATIVE
NITRITE: NEGATIVE
PH: 7 (ref 5.0–8.0)
Protein, ur: NEGATIVE mg/dL
SPECIFIC GRAVITY, URINE: 1.02 (ref 1.005–1.030)

## 2015-10-01 LAB — URINE MICROSCOPIC-ADD ON

## 2015-10-01 LAB — GLUCOSE, CAPILLARY: GLUCOSE-CAPILLARY: 67 mg/dL (ref 65–99)

## 2015-10-01 MED ORDER — ACETAMINOPHEN 500 MG PO TABS
1000.0000 mg | ORAL_TABLET | Freq: Once | ORAL | Status: AC
  Administered 2015-10-01: 1000 mg via ORAL
  Filled 2015-10-01: qty 2

## 2015-10-01 NOTE — Discharge Instructions (Signed)
Monitor Holter  (Holter Monitoring)  El monitor Holter es un pequeño dispositivo que se usa para detectar el ritmo cardíaco anormal. Se lo sujeta a la ropa y, mediante cables, se lo conecta a discos planos adhesivos (electrodos) que se colocan en el pecho. Se lleva puesto de manera ininterrumpida durante 24 a 48 horas.  INSTRUCCIONES PARA EL CUIDADO EN EL HOGAR  · Lleve puesto el monitor Holter en todo momento, incluso mientras hace ejercicio y duerme, durante el tiempo que el médico se lo haya indicado.  · Asegúrese de que el monitor Holter esté bien sujeto a la ropa o cerca del cuerpo, como el médico lo haya recomendado.  · No moje el monitor ni los cables.  · No no se aplique loción ni humectante para el cuerpo en el pecho.  · Mantenga la piel seca.  · Lleve un diario de las actividades cotidianas, por ejemplo, caminar y realizar las tareas diarias. Si siente que el ritmo cardíaco es anormal o que el corazón aletea o se saltea latidos:    Registre lo que está haciendo cuando esto sucede.    Registre la hora del día a la que se presentan los síntomas.  · Devuelva el monitor Holter como se lo haya indicado el médico.  · Concurra a todas las visitas de control como se lo haya indicado el médico. Esto es importante.  SOLICITE ATENCIÓN MÉDICA DE INMEDIATO SI:  · Se siente mareado o se desmaya.  · Tiene dificultad para respirar.  · Tiene dolor en el pecho, la parte superior del brazo o la mandíbula.  · Tiene malestar estomacal y la piel está pálida, fría o húmeda.  · Siente que la frecuencia cardíaca no es la habitual o es anormal.     Esta información no tiene como fin reemplazar el consejo del médico. Asegúrese de hacerle al médico cualquier pregunta que tenga.     Document Released: 04/18/2009 Document Revised: 07/12/2014  Elsevier Interactive Patient Education ©2016 Elsevier Inc.      Palpitaciones  (Palpitations)  Es la sensación de sentir que el latido cardíaco es irregular o es más rápido que lo normal. Se  siente como un aleteo o que falta un latido. Generalmente no es un problema grave. Sin embargo, en algunos casos podría ser necesario hacer más estudios diagnósticos.  CAUSAS   Las causas de las palpitaciones pueden ser:  · Fumar.  · El consumo de cafeína u otros estimulantes, como pastillas para adelgazar o bebidas energizantes.  · Alcohol.  · Situaciones de estrés y ansiedad.  · La actividad física extenuante.  · Fatiga.  · Algunos medicamentos.  · Enfermedad cardíaca, especialmente si tiene antecedentes de ritmo cardíaco irregular (arritmia), como fibrilación auricular, aleteo auricular o taquicardia supraventricular.  · El uso incorrecto de un marcapasos o desfibrilador.  DIAGNÓSTICO   Para hallar la causa de las palpitaciones, el médico le hará una historia clínica y un examen físico. El médico también puede hacerle un estudio llamado electrocardiograma (ECG) ambulatorio. El ECG registra el patrón de los latidos cardíacos durante un período de 24 horas. También pueden hacerle otros estudios, por ejemplo:  · Ecocardiograma transtorácico (ETT). Durante el ecocardiograma, se usan ondas sonoras para evaluar cómo fluye la sangre por el corazón.  · Ecocardiograma transesofágico (ETE).  · Monitoreo cardíaco. Este estudio permite que el médico controle la frecuencia y el ritmo cardíaco en tiempo real.  · Monitor Holter. Es un dispositivo portátil que registra los latidos cardíacos y ayuda a diagnosticar las arritmias cardíacas.   Le permite al médico registrar la actividad cardíaca durante varios días, si es necesario.  · Pruebas de estrés por ejercicio o por medicamentos que aceleran los latidos cardíacos.  TRATAMIENTO   El tratamiento de las palpitaciones depende de la causa y puede variar mucho. En la mayoría de los casos no se requiere otro tratamiento que esperar, relajarse y el controlar los síntomas. Otras causas, como la fibrilación auricular, el aleteo auricular o la taquicardia supraventricular generalmente  requieren un tratamiento.  INSTRUCCIONES PARA EL CUIDADO EN EL HOGAR   · Evite:    Bebidas que contengan cafeína como el café, el té, los refrescos, las pastillas para adelgazar y las bebidas energizantes.    Chocolate.    Alcohol.  · Si fuma, abandone el hábito.  · Reduzca los niveles de estrés y ansiedad. Algunas cosas que pueden ayudarlo a relajarse son:    Un método para controlar el cuerpo con la mente, por ejemplo, controlar los latidos (biorregulación).    El yoga.    La meditación.    La actividad física como natación, trote o caminatas.  · Descanse y duerma lo suficiente.  SOLICITE ATENCIÓN MÉDICA SI:   · Continúa con latidos cardíacos rápidos o irregulares durante más de 24 horas.  · Las palpitaciones le suceden con más frecuencia.  SOLICITE ATENCIÓN MÉDICA DE INMEDIATO SI:  · Siente falta de aire o dolor en el pecho.  · Sufre un dolor intenso de cabeza.  · Se siente mareado o se desmaya.  ASEGÚRESE DE QUE:  · Comprende estas instrucciones.  · Controlará su afección.  · Recibirá ayuda de inmediato si no mejora o si empeora.     Esta información no tiene como fin reemplazar el consejo del médico. Asegúrese de hacerle al médico cualquier pregunta que tenga.     Document Released: 03/31/2005 Document Revised: 06/26/2013  Elsevier Interactive Patient Education ©2016 Elsevier Inc.

## 2015-10-01 NOTE — Telephone Encounter (Signed)
Pt is aware, pt will try to come this afternoon to fill the application for the orange card.

## 2015-10-01 NOTE — Telephone Encounter (Signed)
Pt is aware of MD's recommendations. Pt agreed  For us to make the appointments, and to  call  her with dates and times.

## 2015-10-01 NOTE — Telephone Encounter (Signed)
Left pt a detail message  regarding the orange card application she needs to fill out, to see if it can cover the Holter monitor. I will call pt  later today.

## 2015-10-01 NOTE — MAU Provider Note (Signed)
Chief Complaint: No chief complaint on file.   First Provider Initiated Contact with Patient 10/01/15 1025      SUBJECTIVE HPI: Colleen Coleman is a 26 y.o. G3P2002 at [redacted]w[redacted]d by LMP who presents to maternity admissions reporting worsening palpitations this morning and h/a.  Symptoms started last night, then she slept, and woke up with more palpitations and h/a.  The symptoms have resolved by arrival in MAU but the pt is tearful and reports she is frightened by these episodes.  She also has moderate to severe h/a that is constant and frontal.  She has not tried any treatments including medications for her h/a.  She was seen in MAU on 3/24 for palpitations and referral to cardiology was made. She received phone call yesterday and reports the office plans to call her again today to set up appointment.  Her pregnancy has been uncomplicated and she receives prenatal care at [facility].  She denies abdominal pain, vaginal bleeding, vaginal itching/burning, urinary symptoms, dizziness, n/v, or fever/chills.     HPI  Past Medical History  Diagnosis Date  . Hx of chlamydia infection   . Varicose veins     back of both legs  . Cholelithiasis complicating pregnancy in third trimester, antepartum   . Hyperemesis arising during pregnancy   . Depression   . Obesity (BMI 30.0-34.9)    Past Surgical History  Procedure Laterality Date  . No past surgeries     Social History   Social History  . Marital Status: Single    Spouse Name: N/A  . Number of Children: N/A  . Years of Education: N/A   Occupational History  . Not on file.   Social History Main Topics  . Smoking status: Never Smoker   . Smokeless tobacco: Never Used  . Alcohol Use: No  . Drug Use: No  . Sexual Activity: Yes   Other Topics Concern  . Not on file   Social History Narrative   No current facility-administered medications on file prior to encounter.   Current Outpatient Prescriptions on File Prior  to Encounter  Medication Sig Dispense Refill  . Prenatal Vit-Fe Fumarate-FA (PRENATAL MULTIVITAMIN) TABS tablet Take 1 tablet by mouth daily at 12 noon.     Allergies  Allergen Reactions  . Shrimp [Shellfish Allergy] Hives    ROS:  Review of Systems  Constitutional: Negative for fever, chills and fatigue.  Respiratory: Positive for chest tightness and shortness of breath.   Cardiovascular: Positive for palpitations. Negative for chest pain.  Gastrointestinal: Negative for nausea, vomiting and abdominal pain.  Genitourinary: Negative for dysuria, flank pain, vaginal bleeding, vaginal discharge, difficulty urinating, vaginal pain and pelvic pain.  Neurological: Negative for dizziness and headaches.  Psychiatric/Behavioral: Negative.      I have reviewed patient's Past Medical Hx, Surgical Hx, Family Hx, Social Hx, medications and allergies.   Physical Exam   Patient Vitals for the past 24 hrs:  BP Temp Temp src Pulse Resp SpO2  10/01/15 1247 97/56 mmHg - - 71 - -  10/01/15 1212 101/65 mmHg - - 79 - -  10/01/15 1206 - - - 79 - 100 %  10/01/15 1201 - - - 83 - 100 %  10/01/15 1156 - - - 78 - 100 %  10/01/15 1151 - - - 93 - 100 %  10/01/15 1146 - - - 78 - 100 %  10/01/15 1141 - - - 81 - 100 %  10/01/15 1136 - - -  79 - 100 %  10/01/15 1131 - - - 72 - 100 %  10/01/15 1126 - - - 71 - 100 %  10/01/15 1121 - - - 80 - 100 %  10/01/15 1116 - - - 72 - 100 %  10/01/15 1111 - - - 79 - 100 %  10/01/15 1106 - - - 73 - 100 %  10/01/15 1101 - - - 71 - 100 %  10/01/15 1056 - - - 76 - 100 %  10/01/15 1053 - - - 74 - 100 %  10/01/15 1048 - - - 78 - 100 %  10/01/15 1043 - - - 75 - 100 %  10/01/15 1038 - - - 82 - 100 %  10/01/15 1033 - - - 72 - 100 %  10/01/15 1028 - - - 68 - 100 %  10/01/15 1023 - - - 79 - 99 %  10/01/15 1018 - - - 81 - 99 %  10/01/15 1013 - - - 81 - 99 %  10/01/15 1008 - - - 72 - 99 %  10/01/15 1003 - - - 72 - 99 %  10/01/15 0959 - - - - - 100 %  10/01/15 0844 - - -  - - 100 %  10/01/15 0843 108/61 mmHg 98.5 F (36.9 C) Oral 73 16 100 %   Constitutional: Well-developed, well-nourished female in no acute distress.  HEART: normal rate, heart sounds, regular rhythm RESP: normal effort, lung sounds clear and equal bilaterally GI: Abd soft, non-tender. Pos BS x 4 MS: Extremities nontender, no edema, normal ROM Neurologic: Alert and oriented x 4.  GU: Neg CVAT.  PELVIC EXAM: Deferred  FHT 144 by doppler  LAB RESULTS Results for orders placed or performed during the hospital encounter of 10/01/15 (from the past 24 hour(s))  Urinalysis, Routine w reflex microscopic (not at [facility])     Status: Abnormal   Collection Time: 10/01/15  9:02 AM  Result Value Ref Range   Color, Urine YELLOW YELLOW   APPearance CLEAR CLEAR   Specific Gravity, Urine 1.020 1.005 - 1.030   pH 7.0 5.0 - 8.0   Glucose, UA NEGATIVE NEGATIVE mg/dL   Hgb urine dipstick TRACE (A) NEGATIVE   Bilirubin Urine NEGATIVE NEGATIVE   Ketones, ur 15 (A) NEGATIVE mg/dL   Protein, ur NEGATIVE NEGATIVE mg/dL   Nitrite NEGATIVE NEGATIVE   Leukocytes, UA NEGATIVE NEGATIVE  Urine microscopic-add on     Status: Abnormal   Collection Time: 10/01/15  9:02 AM  Result Value Ref Range   Squamous Epithelial / LPF 0-5 (A) NONE SEEN   WBC, UA 0-5 0 - 5 WBC/hpf   RBC / HPF 0-5 0 - 5 RBC/hpf   Bacteria, UA MANY (A) NONE SEEN   Urine-Other MUCOUS PRESENT   Glucose, capillary     Status: None   Collection Time: 10/01/15 10:39 AM  Result Value Ref Range   Glucose-Capillary 67 65 - 99 mg/dL       IMAGING [place] [facility] Ob Comp + 14 Wk  09/30/2015  OBSTETRICAL ULTRASOUND: This exam was performed within a [facility] Department. The OB US report was generated in the AS system, and faxed to the ordering physician.  This report is available in the [facility] [employer] PACS. See the AS Obstetric US report via the Image Link.   MAU Management/MDM: Ordered labs and reviewed results.  Pt stable in MAU with EKG showing  NSR.  Abnormal EKG on 3/24 with sinus arrhythmia and other  nonspecific findings.  Pt reports significant discomfort with these episodes and reports they did not occur in her previous pregnancies.  Pt stable for discharge but needs follow up with cardiology.  Phone call made to establish appointment but insurance information needed.  Contacted health dept for pt Medicaid information, which was faxed to [facility].  Appt made for April 12 at 2 pm but no holter monitor scheduled. Treatments in MAU included Tylenol 1000 mg PO x 1 with reduction in h/a in MAU. Pt stable at time of discharge.  ASSESSMENT 1. Heart palpitations   2. [redacted] weeks gestation of pregnancy     PLAN Discharge home Pt to follow up as scheduled with cardiology Return to MAU or emergency department if worsening symptoms    Medication List    TAKE these medications        prenatal multivitamin Tabs tablet  Take 1 tablet by mouth daily at 12 noon.       Follow-up Information    Please follow up.   Why:  Cardiology will call you today with appointment., Return to MAU as needed for emergencies      Sharen Counter Certified Nurse-Midwife 10/01/2015  5:14 PM   Addendum: Consult cardiologist on call, Dr Clifton James.  Per Dr Clifton James, outpatient f/u on April 12 recommended, given pt age and presentation, unlikely serious cardiac problem but does warrant follow up.  If pt has increased symptoms, should present to [facility] instead of MAU.  Using Viria, hospital interpreter, called pt after her discharge to give her appointment date and time and to recommend she present to [facility] if symptoms worsen.  Pt states understanding.    Sharen Counter, CNM 6:41 PM

## 2015-10-01 NOTE — Telephone Encounter (Signed)
Left a message that I will call her again tomorrow.

## 2015-10-01 NOTE — MAU Note (Addendum)
Pt reports chest tightness and trouble breathing for a month. She tried to eat this morning, but could not related to her symtoms.

## 2015-10-02 NOTE — Telephone Encounter (Signed)
Patient came to office with another person who speaks/understands english.  She brought an orange card that has expired. She will contact [facility] number on card to find out what she needs to do to renew it. At that time she will be eligible for a holter.

## 2015-10-08 ENCOUNTER — Ambulatory Visit: Payer: Self-pay | Attending: Internal Medicine

## 2015-10-09 ENCOUNTER — Ambulatory Visit (INDEPENDENT_AMBULATORY_CARE_PROVIDER_SITE_OTHER): Payer: No Typology Code available for payment source | Admitting: Cardiology

## 2015-10-09 ENCOUNTER — Encounter: Payer: Self-pay | Admitting: Cardiology

## 2015-10-09 VITALS — BP 102/68 | HR 86 | Ht 62.0 in | Wt 161.0 lb

## 2015-10-09 DIAGNOSIS — R002 Palpitations: Secondary | ICD-10-CM

## 2015-10-09 NOTE — Patient Instructions (Signed)
Medication Instructions:  Your physician recommends that you continue on your current medications as directed. Please refer to the Current Medication list given to you today.  Labwork: None ordered  Testing/Procedures: Your physician has recommended that you wear an event monitor. Event monitors are medical devices that record the heart's electrical activity. Doctors most often us these monitors to diagnose arrhythmias. Arrhythmias are problems with the speed or rhythm of the heartbeat. The monitor is a small, portable device. You can wear one while you do your normal daily activities. This is usually used to diagnose what is causing palpitations/syncope (passing out).  Follow-Up: To be determined once event monitor is scheduled.  If you need a refill on your cardiac medications before your next appointment, please call your pharmacy.  Thank you for choosing [facility]!!   Dory HornSherri Zyron Deeley, RN [phone]  Any Other Special Instructions Will Be Listed Below (If Applicable).

## 2015-10-09 NOTE — Progress Notes (Signed)
Electrophysiology Office Note   Date:  10/09/2015   ID:  sylvania moss, DOB [birth year], MRN [id number]  PCP:  No PCP Per Patient  Primary Electrophysiologist:  Will Jorja Loa, MD    Chief Complaint  Patient presents with  . Palpitations  . New Patient (Initial Visit)  . Abnormal ECG     History of Present Illness: Colleen Coleman is a 25 y.o. female who presents today for electrophysiology evaluation.   She is currently pregnant at approximately 18 weeks.  She presents today for palpitations.she says the last time she had palpitations was last Friday. She feels like her heart is racing she knows of no exacerbating or alleviating factors.she says that the palpitations lasted half the day and were on and off. She says that she went to the [facility] and was apparently told that her heart rate was slow at that time.  She says that she was healthy with her other 2 pregnancies and did not have palpitations.    Today, she denies symptoms of palpitations, chest pain, shortness of breath, orthopnea, PND, lower extremity edema, claudication, dizziness, presyncope, syncope, bleeding, or neurologic sequela. The patient is tolerating medications without difficulties and is otherwise without complaint today.    Past Medical History  Diagnosis Date  . Hx of chlamydia infection   . Varicose veins     back of both legs  . Cholelithiasis complicating pregnancy in third trimester, antepartum   . Hyperemesis arising during pregnancy   . Depression   . Obesity (BMI 30.0-34.9)    Past Surgical History  Procedure Laterality Date  . No past surgeries       Current Outpatient Prescriptions  Medication Sig Dispense Refill  . acetaminophen (TYLENOL) 325 MG tablet Take 325 mg by mouth every 6 (six) hours as needed (pain).    . Prenatal Vit-Fe Fumarate-FA (PRENATAL MULTIVITAMIN) TABS tablet Take 1 tablet by mouth daily at 12 noon.     No current facility-administered  medications for this visit.    Allergies:   Shrimp   Social History:  The patient  reports that she has never smoked. She has never used smokeless tobacco. She reports that she does not drink alcohol or use illicit drugs.   Family History:  The patient's family history includes Depression in her sister; Hypertension in her sister; Other in her mother and sister.    ROS:  Please see the history of present illness.   Otherwise, review of systems is positive for palpitations.   All other systems are reviewed and negative.    PHYSICAL EXAM: VS:  BP 102/68 mmHg  Pulse 86  Ht  (1.575 m)  Wt 161 lb (73.029 kg)  BMI 29.44 kg/m2 , BMI Body mass index is 29.44 kg/(m^2). GEN: Well nourished, well developed, in no acute distress HEENT: normal Neck: no JVD, carotid bruits, or masses Cardiac: RRR; no murmurs, rubs, or gallops,no edema  Respiratory:  clear to auscultation bilaterally, normal work of breathing GI: soft, nontender, nondistended, + BS MS: no deformity or atrophy Skin: warm and dry Neuro:  Strength and sensation are intact Psych: euthymic mood, full affect  EKG:  EKG is ordered today. The ekg ordered today shows sinus rhythm, rSR' in V1  Recent Labs: 09/26/2015: ALT 27; BUN 6; Creatinine, Ser 0.40*; Hemoglobin 11.9*; Platelets 262; Potassium 3.4*; Sodium 138; TSH 0.668    Lipid Panel  No results found for: CHOL, TRIG, HDL, CHOLHDL, VLDL, LDLCALC, LDLDIRECT   Wt Readings  from Last 3 Encounters:  10/09/15 161 lb (73.029 kg)  09/26/15 153 lb (69.4 kg)  12/25/12 162 lb (73.483 kg)    ASSESSMENT AND PLAN:  1.  Palpitations: at this time it is unclear what caused her palpitations actually is. It is possible that she has some sort of SVT. We'll therefore give her a 30 day monitor to further determine her rhythm abnormality, if there is one." She is pregnant, therefore that limits what medications we can choose. We'll not start any at this time.    Current medicines are  reviewed at length with the patient today.   The patient does not have concerns regarding her medicines.  The following changes were made today:  none  Labs/ tests ordered today include:  No orders of the defined types were placed in this encounter.     Disposition:   FU with Will Camnitz post 30 day monitorNot He was given scheduled once the day event monitor.  Signed, Will Jorja LoaMartin Camnitz, MD  10/09/2015 2:53 PM     [facility] [address] [place] [address] [phone] (office) [phone] (fax)

## 2015-10-15 ENCOUNTER — Ambulatory Visit: Payer: Self-pay | Admitting: Cardiology

## 2015-10-17 LAB — OB RESULTS CONSOLE VARICELLA ZOSTER ANTIBODY, IGG: Varicella: IMMUNE

## 2015-11-17 ENCOUNTER — Encounter: Payer: Self-pay | Admitting: *Deleted

## 2015-11-17 NOTE — Progress Notes (Addendum)
10/09/15 Colleen Coleman, I GAVE THIS PATIENT THE HARD SHIP PAPER TO FILL OUT AND BRING BACK BEFOR WE CAN SCHED. THE EVENT MONITOR. THANKS  Received: 1 month ago    [name]  [name], RN     I have attempted to reach patient on several occasions about event monitor/paperwork. Will mail letter to home address asking her to call the office to discuss.

## 2015-12-19 ENCOUNTER — Encounter (HOSPITAL_COMMUNITY): Payer: Self-pay | Admitting: *Deleted

## 2015-12-19 ENCOUNTER — Inpatient Hospital Stay (HOSPITAL_COMMUNITY)
Admission: AD | Admit: 2015-12-19 | Discharge: 2015-12-20 | Disposition: A | Discharge status: Home / Self Care | Payer: Self-pay | Source: Ambulatory Visit | Attending: Obstetrics and Gynecology | Admitting: Obstetrics and Gynecology

## 2015-12-19 DIAGNOSIS — O26613 Liver and biliary tract disorders in pregnancy, third trimester: Secondary | ICD-10-CM | POA: Insufficient documentation

## 2015-12-19 DIAGNOSIS — L282 Other prurigo: Secondary | ICD-10-CM

## 2015-12-19 DIAGNOSIS — O26893 Other specified pregnancy related conditions, third trimester: Secondary | ICD-10-CM | POA: Insufficient documentation

## 2015-12-19 DIAGNOSIS — O99713 Diseases of the skin and subcutaneous tissue complicating pregnancy, third trimester: Secondary | ICD-10-CM

## 2015-12-19 DIAGNOSIS — L299 Pruritus, unspecified: Secondary | ICD-10-CM

## 2015-12-19 DIAGNOSIS — K831 Obstruction of bile duct: Secondary | ICD-10-CM | POA: Insufficient documentation

## 2015-12-19 DIAGNOSIS — Z3A28 28 weeks gestation of pregnancy: Secondary | ICD-10-CM | POA: Insufficient documentation

## 2015-12-19 LAB — COMPREHENSIVE METABOLIC PANEL
ALT: 39 U/L (ref 14–54)
ANION GAP: 6 (ref 5–15)
AST: 30 U/L (ref 15–41)
Albumin: 2.9 g/dL — ABNORMAL LOW (ref 3.5–5.0)
Alkaline Phosphatase: 133 U/L — ABNORMAL HIGH (ref 38–126)
CHLORIDE: 104 mmol/L (ref 101–111)
CO2: 24 mmol/L (ref 22–32)
Calcium: 8.8 mg/dL — ABNORMAL LOW (ref 8.9–10.3)
Creatinine, Ser: 0.38 mg/dL — ABNORMAL LOW (ref 0.44–1.00)
GFR calc Af Amer: >60 mL/min (ref >60)
Glucose, Bld: 98 mg/dL (ref 65–99)
POTASSIUM: 3.7 mmol/L (ref 3.5–5.1)
Sodium: 134 mmol/L — ABNORMAL LOW (ref 135–145)
Total Bilirubin: 0.4 mg/dL (ref 0.3–1.2)
Total Protein: 6.8 g/dL (ref 6.5–8.1)

## 2015-12-19 LAB — URINALYSIS, ROUTINE W REFLEX MICROSCOPIC
BILIRUBIN URINE: NEGATIVE
GLUCOSE, UA: 250 mg/dL — AB
Hgb urine dipstick: NEGATIVE
KETONES UR: NEGATIVE mg/dL
LEUKOCYTES UA: NEGATIVE
NITRITE: NEGATIVE
PROTEIN: NEGATIVE mg/dL
Specific Gravity, Urine: 1.015 (ref 1.005–1.030)
pH: 6 (ref 5.0–8.0)

## 2015-12-19 MED ORDER — DIPHENHYDRAMINE HCL 25 MG PO CAPS
25.0000 mg | ORAL_CAPSULE | Freq: Four times a day (QID) | ORAL | Status: DC | PRN
Start: 2015-12-19 — End: 2015-12-20
  Administered 2015-12-19: 25 mg via ORAL
  Filled 2015-12-19: qty 1

## 2015-12-19 MED ORDER — DIPHENHYDRAMINE HCL 25 MG PO CAPS: 25.0000 mg | ORAL_CAPSULE | Freq: Four times a day (QID) | ORAL | Status: DC | PRN

## 2015-12-19 NOTE — Discharge Instructions (Signed)
Prurito (Pruritus) El prurito es la sensacin de picazn. Hay muchas afecciones y factores diferentes que pueden causar picazn en la piel. La piel seca es una de las causas ms frecuentes de picazn. La mayora de las causas de picazn no requieren atencin mdica. La picazn en la piel puede convertirse en erupcin cutnea.  INSTRUCCIONES PARA EL CUIDADO EN EL HOGAR  Controle el prurito para detectar cualquier cambio. Siga estos pasos para controlar la afeccin:  Cuidado de la piel  Humctese la piel segn sea necesario. Un humectante con vaselina es lo ms adecuado para mantener la humedad de la piel.  Tome o aplquese los medicamentos solamente como se lo haya indicado el mdico. Esto puede incluir lo siguiente:  Crema con corticoides.  Lociones para aliviar la picazn.  Antihistamnicos orales.  Aplique compresas fras en las zonas afectadas.  Trate de tomar un bao con lo siguiente:  Sales de [place]. Siga las instrucciones del envase. Puede conseguirlas en la tienda de comestibles o la farmacia local.  Bicarbonato de sodio. Vierta un poco en la baera como se lo haya indicado el mdico.  Avena coloidal. Siga las instrucciones del envase. Puede conseguirla en la tienda de comestibles o la farmacia local.  Intente colocarse una pasta de bicarbonato de sodio sobre la piel. Agregue agua al bicarbonato de sodio y revuelva hasta alcanzar la consistencia de una pasta.   No se rasque la piel.  Evite los baos de inmersin y las duchas calientes, que pueden empeorar la picazn. La ducha fra puede aliviar la picazn siempre que despus use un humectante.  Evite los detergentes y los jabones perfumados, y los perfumes. Utilice jabones, detergentes, perfumes y cosmticos suaves. Instrucciones generales  Evite usar ropa ajustada.  Lleve un diario como ayuda para registrar lo que le causa picazn. Escriba los siguientes datos:  Lo que come.  Los cosmticos que utiliza.  Lo que  bebe.  La ropa que usa. Esto incluye las alhajas.  Use un humidificador. Este mantiene la humedad del aire, lo que ayuda a evitar la piel seca. SOLICITE ATENCIN MDICA SI:  La picazn no desaparece despus de varios das.  Transpira de noche.  Baja de peso.  Tiene una sed inusual.  Orina ms de lo normal.  Est ms cansado que lo habitual.  Siente dolor abdominal.  Siente hormigueos en la piel.  Se siente dbil.  Tiene un color amarillo en la piel o en la zona blanca del ojo (ictericia).  Siente la piel entumecida.   Esta informacin no tiene como fin reemplazar el consejo del mdico. Asegrese de hacerle al mdico cualquier pregunta que tenga.   Document Released: 03/03/2011 Document Revised: 11/05/2014 Elsevier Interactive Patient Education 2016 Elsevier Inc.  

## 2015-12-19 NOTE — MAU Note (Signed)
I am itching from head to toe. Had cholestasis with last pregnancy. Have not been diagnosed with such this pregnancy. Saw doctor in office today but was late in day so was sent here for testing for cholestasis

## 2015-12-19 NOTE — MAU Provider Note (Signed)
History     CSN: [id number]  Arrival date and time: 12/19/15 2123   First Provider Initiated Contact with Patient 12/19/15 2234        Chief Complaint  Patient presents with  . Pruritis   HPI Patient is 25 y.o. [id number] [redacted]w[redacted]d here with complaints of itching all over. Reports a history of cholestasis in her last pregnancy and this feels similar. SHe was evaluated at the health department today and they recommended coming to the MAU for further evaluation.  Reports she has had itching for about 1 month, starting on her arms but now generalized.  She has not tried benadryl. She has sued vaseline and other lotions but these do not help.   +FM, denies LOF, VB, contractions, vaginal discharge.   OB History    Gravida Para Term Preterm AB TAB SAB Ectopic Multiple Living   Past Medical History  Diagnosis Date  . Hx of chlamydia infection   . Varicose veins     back of both legs  . Cholelithiasis complicating pregnancy in third trimester, antepartum   . Hyperemesis arising during pregnancy   . Depression   . Obesity (BMI 30.0-34.9)     Past Surgical History  Procedure Laterality Date  . No past surgeries      Family History  Problem Relation Age of Onset  . Other Mother     varicosities  . Hypertension Sister   . Other Sister     varicosities  . Depression Sister     Social History  Substance Use Topics  . Smoking status: Never Smoker   . Smokeless tobacco: Never Used  . Alcohol Use: No    Allergies:  Allergies  Allergen Reactions  . Shrimp [Shellfish Allergy] Hives    Prescriptions prior to admission  Medication Sig Dispense Refill Last Dose  . Prenatal Vit-Fe Fumarate-FA (PRENATAL MULTIVITAMIN) TABS tablet Take 1 tablet by mouth daily at 12 noon.   Past Week at Unknown time  . acetaminophen (TYLENOL) 325 MG tablet Take 325 mg by mouth every 6 (six) hours as needed (pain).   Taking    Review of Systems  Constitutional: Negative for  fever and chills.  Eyes: Negative for blurred vision and double vision.  Respiratory: Negative for cough and shortness of breath.   Cardiovascular: Negative for chest pain and orthopnea.  Gastrointestinal: Negative for nausea and vomiting.  Genitourinary: Negative for dysuria, frequency and flank pain.  Musculoskeletal: Negative for myalgias.  Skin: Negative for rash.  Neurological: Negative for dizziness, tingling, weakness and headaches.  Endo/Heme/Allergies: Does not bruise/bleed easily.  Psychiatric/Behavioral: Negative for depression and suicidal ideas. The patient is not nervous/anxious.    Physical Exam   Blood pressure 112/61, pulse 86, temperature 98.3 F (36.8 C), resp. rate 18, height 5' (1.524 m), weight 162 lb (73.483 kg), unknown if currently breastfeeding.  Physical Exam  Nursing note and vitals reviewed. Constitutional: She is oriented to person, place, and time. She appears well-developed and well-nourished. No distress.  Pregnant female  HENT:  Head: Normocephalic and atraumatic.  Eyes: Conjunctivae are normal. No scleral icterus.  Neck: Normal range of motion. Neck supple.  Cardiovascular: Normal rate and intact distal pulses.   Respiratory: Effort normal. She exhibits no tenderness.  GI: Soft. There is no tenderness. There is no rebound and no guarding.  Gravid  Genitourinary: Vagina normal.  Musculoskeletal: Normal range of motion. She exhibits  no edema.  Neurological: She is alert and oriented to person, place, and time.  Skin: Skin is warm and dry. No rash noted.  Psychiatric: She has a normal mood and affect.    MAU Course  Procedures  MDM NST 150/mod/+accels no decels Toco quiet  Results for orders placed or performed during the hospital encounter of 12/19/15 (from the past 24 hour(s))  Urinalysis, Routine w reflex microscopic (not at [facility])     Status: Abnormal   Collection Time: 12/19/15  9:50 PM  Result Value Ref Range   Color, Urine YELLOW  YELLOW   APPearance CLEAR CLEAR   Specific Gravity, Urine 1.015 1.005 - 1.030   pH 6.0 5.0 - 8.0   Glucose, UA 250 (A) NEGATIVE mg/dL   Hgb urine dipstick NEGATIVE NEGATIVE   Bilirubin Urine NEGATIVE NEGATIVE   Ketones, ur NEGATIVE NEGATIVE mg/dL   Protein, ur NEGATIVE NEGATIVE mg/dL   Nitrite NEGATIVE NEGATIVE   Leukocytes, UA NEGATIVE NEGATIVE  Comprehensive metabolic panel     Status: Abnormal (Preliminary result)   Collection Time: 12/19/15 10:56 PM  Result Value Ref Range   Sodium 134 (L) 135 - 145 mmol/L   Potassium 3.7 3.5 - 5.1 mmol/L   Chloride 104 101 - 111 mmol/L   CO2 24 22 - 32 mmol/L   Glucose, Bld 98 65 - 99 mg/dL   BUN PENDING 6 - 20 mg/dL   Creatinine, Ser 1.610.38 (L) 0.44 - 1.00 mg/dL   Calcium 8.8 (L) 8.9 - 10.3 mg/dL   Total Protein 6.8 6.5 - 8.1 g/dL   Albumin 2.9 (L) 3.5 - 5.0 g/dL   AST 30 15 - 41 U/L   ALT 39 14 - 54 U/L   Alkaline Phosphatase 133 (H) 38 - 126 U/L   Total Bilirubin 0.4 0.3 - 1.2 mg/dL   GFR calc non Af Amer >60 >60 mL/min   GFR calc Af Amer >60 >60 mL/min   Anion gap 6 5 - 15     Assessment and Plan  Colleen Coleman is presenting with generalized pruritis that is not responsive to benadryl. Reassured by normal CMP. Collected bile acids to help confirm diagnosis. RF for cholestasis include cholelithiasis, multigravida, and prior history.   #Pruritis- concerning for cholestasis - benadryl q6 hrs prn - follow up bile acids - discussed if elevated she [facility] and briefly discussed starting twice weekly testing with IOL at 37 weeks.   Isa RankinKimberly Niles [facility] 12/19/2015, 10:34 PM

## 2015-12-21 LAB — BILE ACIDS, TOTAL: Bile Acids Total: 10.6 umol/L (ref 4.7–24.5)

## 2015-12-22 LAB — OB RESULTS CONSOLE HGB/HCT, BLOOD
HCT: 34 %
Hemoglobin: 11.4 g/dL

## 2015-12-22 LAB — OB RESULTS CONSOLE RPR: RPR: NONREACTIVE

## 2016-01-05 ENCOUNTER — Encounter: Payer: Self-pay | Attending: Obstetrics & Gynecology | Admitting: Dietician

## 2016-01-05 ENCOUNTER — Ambulatory Visit: Payer: Self-pay | Admitting: *Deleted

## 2016-01-05 DIAGNOSIS — O24410 Gestational diabetes mellitus in pregnancy, diet controlled: Secondary | ICD-10-CM

## 2016-01-05 DIAGNOSIS — Z029 Encounter for administrative examinations, unspecified: Secondary | ICD-10-CM | POA: Insufficient documentation

## 2016-01-05 NOTE — Progress Notes (Signed)
Diabetes/nutrition Education: 01/05/16 Jerlyn LyYurby is a [place] lady.  Interview was conducted with the assistance of the Spanish interpreter. EDD is 03/11/16.  Gives on previous hx of GDM or a family hx of type 2 DM. Since learning that she has GDM, has tried to limit sweets in her diet and to do more walking. Provided handout "Nutricion, Diabetes y [place]". Completed review of GDM.  Provided information for post delivery self-care to assist in prevention of developing type 2 DM later in life. Completed review of the factors affecting blood glucose levels. Encouraged her to walk 30 minutes each day in the cooler part of the day. Completed review of blood glucose monitoring and the blood glucose goals for fasting and 2 hour post meal glucose levels. Currently uninsured.  Provided a True Track meter, box of strips and box of lancets. On return demonstration of self-glucose monitoring, her blood glucose level was 70 mg/dl pre lunch. Instructed to monitor fasting and 2 hour post meal glucose levels.  Record readings and to bring meter and glucose log to all clinic appointments. Advised regarding lancet disposal. Review of the recommended carb restricted diet, carb counting and menu/meal suggestions. Will follow as needed.

## 2016-01-09 ENCOUNTER — Encounter: Payer: Self-pay | Admitting: *Deleted

## 2016-01-09 LAB — OB RESULTS CONSOLE GBS: STREP GROUP B AG: NEGATIVE

## 2016-01-12 ENCOUNTER — Encounter: Payer: Self-pay | Admitting: Obstetrics & Gynecology

## 2016-01-12 ENCOUNTER — Ambulatory Visit (INDEPENDENT_AMBULATORY_CARE_PROVIDER_SITE_OTHER): Payer: Self-pay | Admitting: Obstetrics & Gynecology

## 2016-01-12 ENCOUNTER — Telehealth: Payer: Self-pay | Admitting: *Deleted

## 2016-01-12 VITALS — BP 105/62 | HR 71 | Wt 162.0 lb

## 2016-01-12 DIAGNOSIS — O0990 Supervision of high risk pregnancy, unspecified, unspecified trimester: Secondary | ICD-10-CM | POA: Insufficient documentation

## 2016-01-12 DIAGNOSIS — O26619 Liver and biliary tract disorders in pregnancy, unspecified trimester: Principal | ICD-10-CM

## 2016-01-12 DIAGNOSIS — K831 Obstruction of bile duct: Secondary | ICD-10-CM

## 2016-01-12 DIAGNOSIS — O0993 Supervision of high risk pregnancy, unspecified, third trimester: Secondary | ICD-10-CM

## 2016-01-12 DIAGNOSIS — O26613 Liver and biliary tract disorders in pregnancy, third trimester: Secondary | ICD-10-CM

## 2016-01-12 HISTORY — DX: Supervision of high risk pregnancy, unspecified, unspecified trimester: O09.90

## 2016-01-12 MED ORDER — URSODIOL 500 MG PO TABS: 500.0000 mg | ORAL_TABLET | Freq: Two times a day (BID) | ORAL | Status: DC

## 2016-01-12 NOTE — Progress Notes (Signed)
   Subjective:transfer from [facility] cholestasis    Colleen Coleman is a [id number] 4569w4d being seen today for her first obstetrical visit.  Her obstetrical history is significant for IHCP. Patient does intend to breast feed. Pregnancy history fully reviewed.  Patient reports itching, has no filled Rx for Actigall.  Filed Vitals:   01/12/16 1540  BP: 105/62  Pulse: 71  Weight: 162 lb (73.483 kg)    HISTORY: OB History  Gravida Para Term Preterm AB SAB TAB Ectopic Multiple Living  3 2 2       2     # Outcome Date GA Lbr Len/2nd Weight Sex Delivery Anes PTL Lv  3 Current           2 Term 12/25/12 4475w0d 03:33 / 00:10 6 lb 0.8 oz (2.745 kg) F Vag-Spont None  Y     Complications: Cholestasis of pregnancy  1 Term 2010 405w0d  5 lb 8 oz (2.495 kg) F Vag-Spont   Y     Past Medical History  Diagnosis Date  . Hx of chlamydia infection   . Varicose veins     back of both legs  . Cholelithiasis complicating pregnancy in third trimester, antepartum   . Hyperemesis arising during pregnancy   . Depression   . Obesity (BMI 30.0-34.9)    Past Surgical History  Procedure Laterality Date  . No past surgeries     Family History  Problem Relation Age of Onset  . Other Mother     varicosities  . Hypertension Sister   . Other Sister     varicosities  . Depression Sister      Exam    Uterus:     Pelvic Exam:                               System:     Skin: normal coloration and turgor, no rashes    Neurologic: oriented, normal mood   Extremities: normal strength, tone, and muscle mass   HEENT extra ocular movement intact and thyroid without masses   Mouth/Teeth dental hygiene good   Neck supple   Cardiovascular: regular rate and rhythm   Respiratory:  appears well, vitals normal, no respiratory distress, acyanotic, normal RR   Abdomen: soft, non-tender; bowel sounds normal; no masses,  no organomegaly     Assessment:    Pregnancy: [id number] Patient Active Problem List    Diagnosis Date Noted  . Intrahepatic cholestasis of pregnancy, antepartum 01/12/2016  . Supervision of high risk pregnancy, antepartum 01/12/2016        Plan:     Initial labs drawn. Prenatal vitamins. Problem list reviewed and updated. Genetic Screening discussed Quad Screen: results reviewed.  Ultrasound discussed; fetal survey: ordered.f/u for growth, BPP  Follow up in 1 weeks. 50% of 30 min visit spent on counseling and coordination of care.  2/week testing after 32 weeks  Actigall ordered  Colleen Coleman 01/12/2016

## 2016-01-12 NOTE — Progress Notes (Signed)
Spanish video interpreter "Darl PikesSusan" [phone] used

## 2016-01-12 NOTE — Telephone Encounter (Signed)
Spanish interpreter [phone] used to call patient. Patient informed of ultrasound/BPP appointment in 7/13 @ 9:15 am. Patient voiced understanding.

## 2016-01-12 NOTE — Patient Instructions (Signed)
Ligadura de trompas posparto  (Postpartum Tubal Ligation)  La ligadura de trompas posparto es un procedimiento que se realiza para cerrar las trompas de Falopio inmediatamente después del parto, o uno o dos días después. La ligadura de trompas posparto se realiza antes de que el útero recupere su posición normal. El procedimiento también se denomina minilaparotomía. Cuando las trompas de Falopio se cierran, los óvulos que liberan los ovarios no pueden ingresar al útero, y los espermatozoides no pueden llegar a los óvulos. La ligadura de trompas posparto se realiza para evitar los embarazos.  Si bien este procedimiento puede revertirse, debe considerarse permanente e irreversible. Si usted desea quedar embarazada en el futuro, no debe realizarse este procedimiento.  INFORME A SU MÉDICO:  · Cualquier alergia que tenga.  · Todos los medicamentos que utiliza, incluidos vitaminas, hierbas, gotas oftálmicas, cremas y medicamentos de venta libre. Esto incluye cualquier clase de corticoide, ya sea por vía oral o en crema.  · Problemas previos que usted o los miembros de su familia hayan tenido con el uso de anestésicos.  · Enfermedades de la sangre que tenga.  · Si tiene cirugías previas.  · Si tiene alguna enfermedad.  RIESGOS Y COMPLICACIONES  · Infección.  · Hemorragia.  · Lesión en los órganos circundantes.  · Efectos secundarios de los anestésicos.  · Fallas en el procedimiento.  · Embarazo ectópico.  · Arrepentimiento posterior por haberse realizado el procedimiento.  ANTES DEL PROCEDIMIENTO  · Quizás deba firmar ciertos documentos, incluido un formulario de consentimiento informado, hasta 30 días antes de la fecha de la ligadura de trompas.  · Siga las indicaciones del médico respecto de las restricciones para las comidas y las bebidas.  PROCEDIMIENTO   Si se realiza 1 o 2 días después del parto vaginal:  · Podrán administrarle uno o más de los siguientes medicamentos:    Un medicamento para ayudarla a relajarse  (sedante).    Un medicamento que adormece el área (anestesia local).    Un medicamento que la hará dormir (anestesia general).    Un medicamento que se inyecta en una zona del cuerpo para adormecer toda la región que se encuentra por debajo del lugar de la inyección (anestesia regional).  · Si le administran anestesia general, le introducirán un tubo en la garganta para ayudarla a respirar.  · Es posible que le vacíen la vejiga con una pequeña sonda (catéter).  · Le harán un pequeño corte (incisión) justo por encima de la línea del vello púbico.  · A través de la incisión, las trompas se ubicarán y se subirán.  · Luego, se atarán o quemarán (cauterizarán), o se cerrarán con un clip, un anillo o una abrazadera. En muchos casos, también se eliminará una pequeña parte en el centro de cada trompa de Falopio.  · La incisión se cerrará con puntos (suturas).  · Se colocará un vendaje (apósito) sobre la incisión.  Este procedimiento puede variar según el médico y el hospital.  Si se realiza después de una cesárea:  · La ligadura de trompas se hará a través de la misma incisión del parto por cesárea.  · Después de obstruir las trompas, la incisión se cerrará con puntos (suturas).  · Se colocará un vendaje (apósito) sobre la incisión.  Este procedimiento puede variar según el médico y el hospital.  DESPUÉS DEL PROCEDIMIENTO  · Le controlarán con frecuencia la presión arterial, la frecuencia cardíaca, la frecuencia respiratoria y el nivel de oxígeno en la sangre hasta que haya desaparecido   el efecto de los medicamentos administrados.  · Le darán medicamentos para el dolor si los necesita.  · Si le administraron anestesia general, puede tener algunas molestias leves en la garganta. Esto se debe al tubo que le colocaron en la garganta para poder respirar mientras dormía.  · Puede sentirse cansada, y es conveniente que haga reposo durante el resto del día.  · Puede sentir algo de dolor o calambres en el área abdominal durante 3 a  7 días.     Esta información no tiene como fin reemplazar el consejo del médico. Asegúrese de hacerle al médico cualquier pregunta que tenga.     Document Released: 03/31/2005 Document Revised: 11/05/2014  Elsevier Interactive Patient Education ©2016 Elsevier Inc.

## 2016-01-14 ENCOUNTER — Encounter: Payer: Self-pay | Admitting: *Deleted

## 2016-01-15 ENCOUNTER — Encounter (HOSPITAL_COMMUNITY): Payer: Self-pay

## 2016-01-15 ENCOUNTER — Ambulatory Visit (HOSPITAL_COMMUNITY)
Admission: RE | Admit: 2016-01-15 | Discharge: 2016-01-15 | Disposition: A | Discharge status: Home / Self Care | Payer: Self-pay | Source: Ambulatory Visit | Attending: Obstetrics & Gynecology | Admitting: Obstetrics & Gynecology

## 2016-01-15 ENCOUNTER — Other Ambulatory Visit: Payer: Self-pay | Admitting: Obstetrics & Gynecology

## 2016-01-15 DIAGNOSIS — Z3A32 32 weeks gestation of pregnancy: Secondary | ICD-10-CM

## 2016-01-15 DIAGNOSIS — K831 Obstruction of bile duct: Secondary | ICD-10-CM | POA: Insufficient documentation

## 2016-01-15 DIAGNOSIS — O26619 Liver and biliary tract disorders in pregnancy, unspecified trimester: Secondary | ICD-10-CM

## 2016-01-15 DIAGNOSIS — O26613 Liver and biliary tract disorders in pregnancy, third trimester: Secondary | ICD-10-CM | POA: Insufficient documentation

## 2016-01-15 DIAGNOSIS — Z36 Encounter for antenatal screening of mother: Secondary | ICD-10-CM | POA: Insufficient documentation

## 2016-01-22 ENCOUNTER — Encounter: Payer: Self-pay | Admitting: Obstetrics and Gynecology

## 2016-01-27 ENCOUNTER — Encounter: Payer: Self-pay | Admitting: Family Medicine

## 2016-01-29 ENCOUNTER — Other Ambulatory Visit (HOSPITAL_COMMUNITY): Payer: Self-pay | Admitting: Maternal and Fetal Medicine

## 2016-01-29 ENCOUNTER — Ambulatory Visit (HOSPITAL_COMMUNITY): Admission: RE | Admit: 2016-01-29 | Payer: Self-pay | Source: Ambulatory Visit

## 2016-01-29 ENCOUNTER — Ambulatory Visit (HOSPITAL_COMMUNITY)
Admission: RE | Admit: 2016-01-29 | Discharge: 2016-01-29 | Disposition: A | Discharge status: Home / Self Care | Payer: Self-pay | Source: Ambulatory Visit | Attending: Obstetrics & Gynecology | Admitting: Obstetrics & Gynecology

## 2016-01-29 ENCOUNTER — Other Ambulatory Visit: Payer: Self-pay | Admitting: *Deleted

## 2016-01-29 ENCOUNTER — Encounter (HOSPITAL_COMMUNITY): Payer: Self-pay

## 2016-01-29 DIAGNOSIS — K831 Obstruction of bile duct: Secondary | ICD-10-CM | POA: Insufficient documentation

## 2016-01-29 DIAGNOSIS — Z3A34 34 weeks gestation of pregnancy: Secondary | ICD-10-CM | POA: Insufficient documentation

## 2016-01-29 DIAGNOSIS — O26619 Liver and biliary tract disorders in pregnancy, unspecified trimester: Secondary | ICD-10-CM

## 2016-01-29 DIAGNOSIS — O4103X0 Oligohydramnios, third trimester, not applicable or unspecified: Secondary | ICD-10-CM

## 2016-01-29 DIAGNOSIS — O26613 Liver and biliary tract disorders in pregnancy, third trimester: Secondary | ICD-10-CM | POA: Insufficient documentation

## 2016-01-29 MED ORDER — URSODIOL 300 MG PO CAPS: 300.0000 mg | ORAL_CAPSULE | Freq: Three times a day (TID) | ORAL | 1 refills | Status: DC

## 2016-01-29 MED FILL — URSODIOL 300 MG CAPSULE: 300 | 30 days supply | Qty: 90 | Fill #0

## 2016-02-02 ENCOUNTER — Other Ambulatory Visit: Payer: Self-pay | Admitting: Obstetrics and Gynecology

## 2016-02-03 ENCOUNTER — Other Ambulatory Visit: Payer: Self-pay | Admitting: Family

## 2016-02-05 ENCOUNTER — Ambulatory Visit (INDEPENDENT_AMBULATORY_CARE_PROVIDER_SITE_OTHER): Payer: Self-pay | Admitting: Obstetrics & Gynecology

## 2016-02-05 VITALS — BP 108/69 | HR 93

## 2016-02-05 DIAGNOSIS — K831 Obstruction of bile duct: Secondary | ICD-10-CM

## 2016-02-05 DIAGNOSIS — O26619 Liver and biliary tract disorders in pregnancy, unspecified trimester: Secondary | ICD-10-CM

## 2016-02-09 ENCOUNTER — Ambulatory Visit (INDEPENDENT_AMBULATORY_CARE_PROVIDER_SITE_OTHER): Payer: Self-pay | Admitting: Obstetrics & Gynecology

## 2016-02-09 VITALS — BP 110/61 | HR 95 | Wt 164.0 lb

## 2016-02-09 DIAGNOSIS — K831 Obstruction of bile duct: Secondary | ICD-10-CM

## 2016-02-09 DIAGNOSIS — O0993 Supervision of high risk pregnancy, unspecified, third trimester: Secondary | ICD-10-CM

## 2016-02-09 DIAGNOSIS — O26619 Liver and biliary tract disorders in pregnancy, unspecified trimester: Principal | ICD-10-CM

## 2016-02-09 DIAGNOSIS — O26613 Liver and biliary tract disorders in pregnancy, third trimester: Secondary | ICD-10-CM

## 2016-02-09 LAB — POCT URINALYSIS DIP (DEVICE)
BILIRUBIN URINE: NEGATIVE
GLUCOSE, UA: 250 mg/dL — AB
LEUKOCYTES UA: NEGATIVE
Nitrite: NEGATIVE
Protein, ur: 30 mg/dL — AB
Specific Gravity, Urine: 1.02 (ref 1.005–1.030)
Urobilinogen, UA: 0.2 mg/dL (ref 0.0–1.0)
pH: 7 (ref 5.0–8.0)

## 2016-02-09 NOTE — Progress Notes (Signed)
Interpreter used for encounter.  IOL scheduled 8/16 @ 0630

## 2016-02-09 NOTE — Progress Notes (Signed)
Subjective:  Colleen Coleman is a 25 y.o. G3P2002 at 1628w4d being seen today for ongoing prenatal care.  She is currently monitored for the following issues for this high-risk pregnancy and has Intrahepatic cholestasis of pregnancy, antepartum and Supervision of high risk pregnancy, antepartum on her problem list.  Patient reports no complaints.  Contractions: Irregular. Vag. Bleeding: None.  Movement: Present. Denies leaking of fluid.   The following portions of the patient's history were reviewed and updated as appropriate: allergies, current medications, past family history, past medical history, past social history, past surgical history and problem list. Problem list updated.  Objective:   Vitals:   02/09/16 0924  BP: 110/61  Pulse: 95  Weight: 164 lb (74.4 kg)    Fetal Status: Fetal Heart Rate (bpm): NST   Movement: Present     General:  Alert, oriented and cooperative. Patient is in no acute distress.  Skin: Skin is warm and dry. No rash noted.   Cardiovascular: Normal heart rate noted  Respiratory: Normal respiratory effort, no problems with respiration noted  Abdomen: Soft, gravid, appropriate for gestational age. Pain/Pressure: Present     Pelvic:  Cervical exam performed        Extremities: Normal range of motion.  Edema: None  Mental Status: Normal mood and affect. Normal behavior. Normal judgment and thought content.   Urinalysis: Urine Protein: 1+ Urine Glucose: 2+  Assessment and Plan:  Pregnancy: G3P2002 at 2728w4d  1. Intrahepatic cholestasis of pregnancy, antepartum, unspecified trimester  - Fetal nonstress test  2. Supervision of high risk pregnancy, antepartum, third trimester  - Culture, beta strep (group b only) - GC/Chlamydia probe amp ([place]  Preterm labor symptoms and general obstetric precautions including but not limited to vaginal bleeding, contractions, leaking of fluid and fetal movement were reviewed in detail with the  patient. Please refer to After Visit Summary for other counseling recommendations.  Return in about 1 week (around 02/16/2016) for Ob fu and NST.   Allie BossierMyra C Demitris Pokorny, MD

## 2016-02-09 NOTE — Progress Notes (Signed)
Subjective:  Colleen Coleman is a 25 y.o. G3P2002 at 1554w4d being seen today for ongoing prenatal care.  She is currently monitored for the following issues for this high-risk pregnancy and has Intrahepatic cholestasis of pregnancy, antepartum and Supervision of high risk pregnancy, antepartum on her problem list.  Patient reports no complaints.  Contractions: Irregular. Vag. Bleeding: None.  Movement: Present. Denies leaking of fluid.   The following portions of the patient's history were reviewed and updated as appropriate: allergies, current medications, past family history, past medical history, past social history, past surgical history and problem list. Problem list updated.  Objective:   Vitals:   02/09/16 0924  BP: 110/61  Pulse: 95  Weight: 164 lb (74.4 kg)    Fetal Status: Fetal Heart Rate (bpm): NST   Movement: Present     General:  Alert, oriented and cooperative. Patient is in no acute distress.  Skin: Skin is warm and dry. No rash noted.   Cardiovascular: Normal heart rate noted  Respiratory: Normal respiratory effort, no problems with respiration noted  Abdomen: Soft, gravid, appropriate for gestational age. Pain/Pressure: Present     Pelvic:  Cervical exam performed        Extremities: Normal range of motion.  Edema: None  Mental Status: Normal mood and affect. Normal behavior. Normal judgment and thought content.   Urinalysis:      Assessment and Plan:  Pregnancy: G3P2002 at 1854w4d  1. Intrahepatic cholestasis of pregnancy, antepartum, unspecified trimester  - Fetal nonstress test  2. Supervision of high risk pregnancy, antepartum, third trimester - Culture, beta strep (group b only) - GC[facility]Chlamydia probe amp [facility]  Preterm labor symptoms and general obstetric precautions including but not limited to vaginal bleeding, contractions, leaking of fluid and fetal movement were reviewed in detail with the patient. Please refer to After Visit  Summary for other counseling recommendations.  Return in about 1 week (around 02/16/2016) for Ob fu and NST.   Allie BossierMyra C Kalob Bergen, MD

## 2016-02-11 LAB — CULTURE, BETA STREP (GROUP B ONLY)

## 2016-02-13 ENCOUNTER — Ambulatory Visit (INDEPENDENT_AMBULATORY_CARE_PROVIDER_SITE_OTHER): Payer: Self-pay | Admitting: *Deleted

## 2016-02-13 VITALS — BP 109/63 | HR 97

## 2016-02-13 DIAGNOSIS — K831 Obstruction of bile duct: Secondary | ICD-10-CM

## 2016-02-13 DIAGNOSIS — O26613 Liver and biliary tract disorders in pregnancy, third trimester: Secondary | ICD-10-CM

## 2016-02-13 DIAGNOSIS — O26619 Liver and biliary tract disorders in pregnancy, unspecified trimester: Principal | ICD-10-CM

## 2016-02-13 DIAGNOSIS — Z36 Encounter for antenatal screening of mother: Secondary | ICD-10-CM

## 2016-02-13 MED ORDER — HYDROXYZINE HCL 25 MG PO TABS: 25.0000 mg | ORAL_TABLET | Freq: Three times a day (TID) | ORAL | 0 refills | Status: DC | PRN

## 2016-02-13 MED FILL — hydrOXYzine HCL 25 MG TABS: 25 | 10 days supply | Qty: 30 | Fill #0

## 2016-02-13 NOTE — Progress Notes (Signed)
NST reviewed and reactive.  

## 2016-02-13 NOTE — Progress Notes (Addendum)
Video interpreter [place] # [phone] used for encounter.  IOL incorrectly scheduled on 8/16 - changed to 8/17 @ 0730.

## 2016-02-16 ENCOUNTER — Telehealth (HOSPITAL_COMMUNITY): Payer: Self-pay | Admitting: *Deleted

## 2016-02-16 ENCOUNTER — Ambulatory Visit (INDEPENDENT_AMBULATORY_CARE_PROVIDER_SITE_OTHER): Payer: Self-pay | Admitting: Obstetrics and Gynecology

## 2016-02-16 VITALS — BP 111/66 | HR 90 | Wt 162.4 lb

## 2016-02-16 DIAGNOSIS — O0993 Supervision of high risk pregnancy, unspecified, third trimester: Secondary | ICD-10-CM

## 2016-02-16 DIAGNOSIS — O26613 Liver and biliary tract disorders in pregnancy, third trimester: Secondary | ICD-10-CM

## 2016-02-16 DIAGNOSIS — K831 Obstruction of bile duct: Secondary | ICD-10-CM

## 2016-02-16 LAB — POCT URINALYSIS DIP (DEVICE)
GLUCOSE, UA: 250 mg/dL — AB
Hgb urine dipstick: NEGATIVE
Ketones, ur: NEGATIVE mg/dL
LEUKOCYTES UA: NEGATIVE
NITRITE: NEGATIVE
Protein, ur: 30 mg/dL — AB
SPECIFIC GRAVITY, URINE: 1.025 (ref 1.005–1.030)
UROBILINOGEN UA: 4 mg/dL — AB (ref 0.0–1.0)
pH: 6.5 (ref 5.0–8.0)

## 2016-02-16 NOTE — Progress Notes (Signed)
Subjective:  Colleen Coleman is a 25 y.o. G3P2002 at 7172w4d being seen today for ongoing prenatal care.  She is currently monitored for the following issues for this high-risk pregnancy and has Intrahepatic cholestasis of pregnancy, antepartum and Supervision of high risk pregnancy, antepartum on her problem list.  Patient reports no complaints.  Contractions: Irregular. Vag. Bleeding: None.  Movement: Present. Denies leaking of fluid.   The following portions of the patient's history were reviewed and updated as appropriate: allergies, current medications, past family history, past medical history, past social history, past surgical history and problem list. Problem list updated.  Objective:   Vitals:   02/16/16 0816  BP: 111/66  Pulse: 90  Weight: 162 lb 6.4 oz (73.7 kg)    Fetal Status:     Movement: Present     General:  Alert, oriented and cooperative. Patient is in no acute distress.  Skin: Skin is warm and dry. No rash noted.   Cardiovascular: Normal heart rate noted  Respiratory: Normal respiratory effort, no problems with respiration noted  Abdomen: Soft, gravid, appropriate for gestational age. Pain/Pressure: Present     Pelvic:  Cervical exam deferred        Extremities: Normal range of motion.  Edema: None  Mental Status: Normal mood and affect. Normal behavior. Normal judgment and thought content.   Urinalysis: Urine Protein: 1+ Urine Glucose: 2+  Assessment and Plan:  Pregnancy: G3P2002 at 2072w4d  1. Supervision of high risk pregnancy, antepartum, third trimester NST reviewed and reactive - Fetal nonstress test  2. Intrahepatic cholestasis of pregnancy, antepartum, third trimester Patient scheduled for IOL at 37 week  Answered questions regarding IOL - Fetal nonstress test  Preterm labor symptoms and general obstetric precautions including but not limited to vaginal bleeding, contractions, leaking of fluid and fetal movement were reviewed in detail with the  patient. Please refer to After Visit Summary for other counseling recommendations.  Return in about 6 weeks (around 03/29/2016) for Has IOL 02/19/16. Catalina Antigua.   Tollie Canada, MD

## 2016-02-16 NOTE — Progress Notes (Signed)
Used Interpreter [employer] Coleman.

## 2016-02-16 NOTE — Telephone Encounter (Signed)
Preadmission screen  

## 2016-02-17 NOTE — Telephone Encounter (Signed)
Preadmission screen Interpreter number [phone]

## 2016-02-18 ENCOUNTER — Inpatient Hospital Stay (HOSPITAL_COMMUNITY): Admission: RE | Admit: 2016-02-18 | Payer: Self-pay | Source: Ambulatory Visit

## 2016-02-19 ENCOUNTER — Encounter (HOSPITAL_COMMUNITY): Payer: Self-pay

## 2016-02-19 ENCOUNTER — Inpatient Hospital Stay (HOSPITAL_COMMUNITY)
Admission: RE | Admit: 2016-02-19 | Discharge: 2016-02-21 | DRG: 775 | Disposition: A | Discharge status: Home / Self Care | Payer: Medicaid Other | Source: Ambulatory Visit | Attending: Obstetrics & Gynecology | Admitting: Obstetrics & Gynecology

## 2016-02-19 VITALS — BP 92/59 | HR 65 | Temp 98.1°F | Resp 18 | Ht 60.0 in | Wt 162.0 lb

## 2016-02-19 DIAGNOSIS — Z3A37 37 weeks gestation of pregnancy: Secondary | ICD-10-CM | POA: Diagnosis not present

## 2016-02-19 DIAGNOSIS — O26613 Liver and biliary tract disorders in pregnancy, third trimester: Secondary | ICD-10-CM

## 2016-02-19 DIAGNOSIS — Z818 Family history of other mental and behavioral disorders: Secondary | ICD-10-CM | POA: Diagnosis not present

## 2016-02-19 DIAGNOSIS — O2662 Liver and biliary tract disorders in childbirth: Principal | ICD-10-CM | POA: Diagnosis present

## 2016-02-19 DIAGNOSIS — O0993 Supervision of high risk pregnancy, unspecified, third trimester: Secondary | ICD-10-CM

## 2016-02-19 DIAGNOSIS — Z8249 Family history of ischemic heart disease and other diseases of the circulatory system: Secondary | ICD-10-CM | POA: Diagnosis not present

## 2016-02-19 DIAGNOSIS — K831 Obstruction of bile duct: Secondary | ICD-10-CM | POA: Diagnosis present

## 2016-02-19 DIAGNOSIS — O26643 Intrahepatic cholestasis of pregnancy, third trimester: Secondary | ICD-10-CM

## 2016-02-19 HISTORY — DX: Liver and biliary tract disorders in pregnancy, third trimester: O26.613

## 2016-02-19 HISTORY — DX: Intrahepatic cholestasis of pregnancy, third trimester: O26.643

## 2016-02-19 HISTORY — DX: Obstruction of bile duct: K83.1

## 2016-02-19 LAB — CBC
HEMATOCRIT: 31.2 % — AB (ref 36.0–46.0)
HEMOGLOBIN: 10.5 g/dL — AB (ref 12.0–15.0)
MCH: 27.1 pg (ref 26.0–34.0)
MCHC: 33.7 g/dL (ref 30.0–36.0)
MCV: 80.4 fL (ref 78.0–100.0)
Platelets: 329 10*3/uL (ref 150–400)
RBC: 3.88 MIL/uL (ref 3.87–5.11)
RDW: 14 % (ref 11.5–15.5)
WBC: 9.9 10*3/uL (ref 4.0–10.5)

## 2016-02-19 LAB — RPR: RPR Ser Ql: NONREACTIVE

## 2016-02-19 LAB — TYPE AND SCREEN
ABO/RH(D): A POS
Antibody Screen: NEGATIVE

## 2016-02-19 MED ORDER — LACTATED RINGERS IV SOLN
INTRAVENOUS | Status: DC
  Administered 2016-02-19 (×3): via INTRAVENOUS

## 2016-02-19 MED ORDER — OXYTOCIN BOLUS FROM INFUSION
500.0000 mL | Freq: Once | INTRAVENOUS | Status: AC
Start: 2016-02-19 — End: 2016-02-20
  Administered 2016-02-20: 500 mL via INTRAVENOUS

## 2016-02-19 MED ORDER — MISOPROSTOL 25 MCG QUARTER TABLET
25.0000 ug | ORAL_TABLET | ORAL | Status: DC | PRN
  Administered 2016-02-19 (×2): 25 ug via VAGINAL
  Filled 2016-02-19 (×2): qty 0.25
  Filled 2016-02-19: qty 1
  Filled 2016-02-19: qty 0.25

## 2016-02-19 MED ORDER — LIDOCAINE HCL (PF) 1 % IJ SOLN
30.0000 mL | INTRAMUSCULAR | Status: DC | PRN
  Filled 2016-02-19: qty 30

## 2016-02-19 MED ORDER — ONDANSETRON HCL 4 MG/2ML IJ SOLN: 4.0000 mg | Freq: Four times a day (QID) | INTRAMUSCULAR | Status: DC | PRN

## 2016-02-19 MED ORDER — ACETAMINOPHEN 325 MG PO TABS: 650.0000 mg | ORAL_TABLET | ORAL | Status: DC | PRN

## 2016-02-19 MED ORDER — FENTANYL CITRATE (PF) 100 MCG/2ML IJ SOLN
100.0000 ug | INTRAMUSCULAR | Status: DC | PRN
  Administered 2016-02-19 (×2): 100 ug via INTRAVENOUS
  Filled 2016-02-19 (×2): qty 2

## 2016-02-19 MED ORDER — HYDROXYZINE HCL 25 MG PO TABS
25.0000 mg | ORAL_TABLET | Freq: Three times a day (TID) | ORAL | Status: DC | PRN
  Filled 2016-02-19 (×2): qty 1

## 2016-02-19 MED ORDER — TERBUTALINE SULFATE 1 MG/ML IJ SOLN
0.2500 mg | Freq: Once | INTRAMUSCULAR | Status: DC | PRN
  Filled 2016-02-19: qty 1

## 2016-02-19 MED ORDER — OXYTOCIN 40 UNITS IN LACTATED RINGERS INFUSION - SIMPLE MED
2.5000 [IU]/h | INTRAVENOUS | Status: DC
  Filled 2016-02-19: qty 1000

## 2016-02-19 MED ORDER — OXYCODONE-ACETAMINOPHEN 5-325 MG PO TABS: 1.0000 | ORAL_TABLET | ORAL | Status: DC | PRN

## 2016-02-19 MED ORDER — LACTATED RINGERS IV SOLN: 500.0000 mL | INTRAVENOUS | Status: DC | PRN

## 2016-02-19 MED ORDER — OXYCODONE-ACETAMINOPHEN 5-325 MG PO TABS: 2.0000 | ORAL_TABLET | ORAL | Status: DC | PRN

## 2016-02-19 MED ORDER — SOD CITRATE-CITRIC ACID 500-334 MG/5ML PO SOLN: 30.0000 mL | ORAL | Status: DC | PRN

## 2016-02-19 MED ORDER — URSODIOL 300 MG PO CAPS
300.0000 mg | ORAL_CAPSULE | Freq: Three times a day (TID) | ORAL | Status: DC
  Administered 2016-02-19: 300 mg via ORAL
  Filled 2016-02-19 (×4): qty 1

## 2016-02-19 NOTE — Progress Notes (Signed)
   Colleen Coleman is a 25 y.o. G3P2002 at 7049w0d  admitted for induction of labor due to cholestasis.  Subjective: Wants IV pain meds  Objective: Vitals:   02/19/16 2101 02/19/16 2133 02/19/16 2202 02/19/16 2237  BP: 123/81 110/76 127/70 132/84  Pulse: 87 98 99 90  Resp: 18 18 18 18   Temp:      TempSrc:      Weight:      Height:       No intake/output data recorded.  FHT:  FHR: 140 bpm, variability: moderate,  accelerations:  Present,  decelerations:  Absent UC:   regular, every 1-2 minutes SVE:   Dilation: 6.5 Effacement (%): 50 Station: -3 Exam by:: Drenda FreezeFran, CNM   Labs: Lab Results  Component Value Date   WBC 9.9 02/19/2016   HGB 10.5 (L) 02/19/2016   HCT 31.2 (L) 02/19/2016   MCV 80.4 02/19/2016   PLT 329 02/19/2016    Assessment / Plan: IOL , progressing well .  Foley in vagina, removed  Labor: Progressing normally Fetal Wellbeing:  Category I Pain Control:  IV pain meds Anticipated MOD:  NSVD  CRESENZO-DISHMAN,Colleen Coleman 02/19/2016, 10:46 PM

## 2016-02-19 NOTE — Anesthesia Pain Management Evaluation Note (Signed)
  CRNA Pain Management Visit Note  Patient: Colleen Coleman, 25 y.o., female  "Hello I am a member of the anesthesia team at [facility]. We have an anesthesia team available at all times to provide care throughout the hospital, including epidural management and anesthesia for C-section. I don't know your plan for the delivery whether it a natural birth, water birth, IV sedation, nitrous supplementation, doula or epidural, but we want to meet your pain goals."   1.Was your pain managed to your expectations on prior hospitalizations?   Yes   2.What is your expectation for pain management during this hospitalization?     Labor support without medications  3.How can we help you reach that goal? Natural childbirth.  Spanish interpreter at bedside during pain consult.  Record the patient's initial score and the patient's pain goal.   Pain: 0  Pain Goal: 5 [facility] wants you to be able to say your pain was always managed very well.  Colleen Coleman L 02/19/2016

## 2016-02-19 NOTE — H&P (Signed)
LABOR AND DELIVERY ADMISSION HISTORY AND PHYSICAL NOTE  Colleen Coleman is Colleen Coleman 25 y.o. female 783P2002 with IUP at 1756w0d by LMP presenting for IOL for cholestasis.   She reports positive fetal movement. She denies leakage of fluid or vaginal bleeding.  Prenatal History/Complications: cholestasis  Past Medical History: Past Medical History:  Diagnosis Date  . Cholelithiasis complicating pregnancy in third trimester, antepartum   . Depression   . Hx of chlamydia infection   . Hyperemesis arising during pregnancy   . Obesity (BMI 30.0-34.9)   . Varicose veins    back of both legs    Past Surgical History: Past Surgical History:  Procedure Laterality Date  . NO PAST SURGERIES      Obstetrical History: OB History    Gravida Para Term Preterm AB Living   3 2 2     2    SAB TAB Ectopic Multiple Live Births           2      Social History: Social History   Social History  . Marital status: Single    Spouse name: N/A  . Number of children: N/A  . Years of education: N/A   Social History Main Topics  . Smoking status: Never Smoker  . Smokeless tobacco: Never Used  . Alcohol use No  . Drug use: No  . Sexual activity: Not Currently   Other Topics Concern  . None   Social History Narrative  . None    Family History: Family History  Problem Relation Age of Onset  . Other Mother     varicosities  . Hypertension Sister   . Other Sister     varicosities  . Depression Sister     Allergies: Allergies  Allergen Reactions  . Penicillins   . Shrimp [Shellfish Allergy] Hives    Prescriptions Prior to Admission  Medication Sig Dispense Refill Last Dose  . acetaminophen (TYLENOL) 325 MG tablet Take 325 mg by mouth every 6 (six) hours as needed (pain). Reported on 01/15/2016   Taking  . diphenhydrAMINE (BENADRYL) 25 mg capsule Take 1 capsule (25 mg total) by mouth every 6 (six) hours as needed for itching. 30 capsule 1 Taking  . hydrOXYzine (ATARAX/VISTARIL) 25  MG tablet Take 1 tablet (25 mg total) by mouth 3 (three) times daily as needed. 30 tablet 0 Taking  . Prenatal Vit-Fe Fumarate-FA (PRENATAL MULTIVITAMIN) TABS tablet Take 1 tablet by mouth daily at 12 noon.   Taking  . ursodiol (ACTIGALL) 300 MG capsule Take 1 capsule (300 mg total) by mouth 3 (three) times daily. (Patient not taking: Reported on 02/16/2016) 90 capsule 1 Not Taking     Review of Systems   All systems reviewed and negative except as stated in HPI  Blood pressure 123/78, pulse 80, temperature 98.7 F (37.1 C), temperature source Oral, resp. rate 18, height 5' (1.524 m), weight 73.5 kg (162 lb), unknown if currently breastfeeding. General appearance: alert, cooperative and no distress Lungs: no respiratory distress Heart: regular rate Abdomen: soft, non-tender Extremities: No calf swelling or tenderness Presentation: cephalic by ultrasound Fetal monitoring: 140 baseline, mod variability, +accelerations, no decelerations Uterine activity: q224min Dilation: Fingertip Effacement (%): 50 Station: -3 Exam by:: Melene Planhristina Woods,RN and Mary Swazil[place]Jordan Johnson, RN   Prenatal labs: ABO, Rh: A/Positive/-- (03/27 0000) Antibody: Negative (03/27 0000) Rubella: immune RPR: Nonreactive (06/19 0000)  HBsAg: Negative (03/27 0000)  HIV: Non-reactive (03/27 0000)  GBS: Negative (07/07 0000)  Genetic screening:  declined Anatomy  US: normal  Prenatal Transfer Tool  Maternal Diabetes: No Genetic Screening: Declined Maternal Ultrasounds/Referrals: Normal Fetal Ultrasounds or other Referrals:  None Maternal Substance Abuse:  No Significant Maternal Medications:  None Significant Maternal Lab Results: Lab values include: Group B Strep negative  Results for orders placed or performed during the hospital encounter of 02/19/16 (from the past 24 hour(s))  CBC   Collection Time: 02/19/16  8:20 AM  Result Value Ref Range   WBC 9.9 4.0 - 10.5 K/uL   RBC 3.88 3.87 - 5.11 MIL/uL   Hemoglobin  10.5 (L) 12.0 - 15.0 g/dL   HCT 16.131.2 (L) 09.636.0 - 04.546.0 %   MCV 80.4 78.0 - 100.0 fL   MCH 27.1 26.0 - 34.0 pg   MCHC 33.7 30.0 - 36.0 g/dL   RDW 40.914.0 81.111.5 - 91.415.5 %   Platelets 329 150 - 400 K/uL    Patient Active Problem List   Diagnosis Date Noted  . Cholestasis of pregnancy in third trimester 02/19/2016  . Intrahepatic cholestasis of pregnancy, antepartum 01/12/2016  . Supervision of high risk pregnancy, antepartum 01/12/2016    Assessment: Colleen Coleman is a 25 y.o. G3P2002 at 5869w0d here for IOL for cholestasis  #Labor:cytotec #Pain: None, patient desires natural birth #FWB: Category I #ID:  GBS neg #MOF: breast #MOC:depo   Leland HerElsia J Yoo, DO PGY-1 8/17/201710:18 AM   OB FELLOW HISTORY AND PHYSICAL ATTESTATION  I have seen and examined this patient; I agree with above documentation in the resident's note.    Jen MowElizabeth Lilian Fuhs, DO OB Fellow 02/19/2016, 10:36 AM

## 2016-02-19 NOTE — Progress Notes (Signed)
Patient ID: Colleen Coleman, female   DOB: 06-27-1991, 25 y.o.   MRN: [id number]  LABOR PROGRESS NOTE  Colleen Coleman is a 25 y.o. G3P2002 at 932w0d  admitted for IOL for cholestasis.  Subjective: Patient seen & examined for progress of induction of labor. Patient is contracting q92min, states her contraction pain is 7/10, but wants to do this "naturally" without pain meds or epidural. Patient had placement of 2 cytotecs so far. Discussed foley bulb placement and patient is agreeable to plan.  Objective: BP 114/72   Pulse 78   Temp 98.5 F (36.9 C) (Oral)   Resp 17   Ht 5' (1.524 m)   Wt 162 lb (73.5 kg)   BMI 31.64 kg/m  or  Vitals:   02/19/16 1258 02/19/16 1410 02/19/16 1504 02/19/16 1722  BP: 117/71 117/66 116/75 114/72  Pulse: 68 76 82 78  Resp:   16 17  Temp:   98.8 F (37.1 C) 98.5 F (36.9 C)  TempSrc:   Oral Oral  Weight:      Height:        FHT: 130,. Mod var, +accels, -decels TOCO: q252min Dilation: 2 Effacement (%): Thick Station: -3 Presentation: Vertex Exam by:: Dr. Omer JackMumaw  Labs: Lab Results  Component Value Date   WBC 9.9 02/19/2016   HGB 10.5 (L) 02/19/2016   HCT 31.2 (L) 02/19/2016   MCV 80.4 02/19/2016   PLT 329 02/19/2016    Patient Active Problem List   Diagnosis Date Noted  . Cholestasis of pregnancy in third trimester 02/19/2016  . Intrahepatic cholestasis of pregnancy, antepartum 01/12/2016  . Supervision of high risk pregnancy, antepartum 01/12/2016    Assessment / Plan: 25 y.o. G3P2002 at 7732w0d here for IOL 2/2 cholestasis.  Labor: Continue induction, foley bulb placed. Fetal Wellbeing:  Cat I Pain Control:  Non-pharmacological measures Anticipated MOD:  SVD  Jen MowElizabeth Xitlali Kastens, DO OB Fellow [facility] 02/19/2016, 6:01 PM

## 2016-02-19 NOTE — Progress Notes (Signed)
LABOR PROGRESS NOTE  Colleen Coleman is a 25 y.o. G3P2002 at 4576w0d  admitted for IOL for cholestasis  Subjective: History with interpreter at bedside. States feels well, no complaints.  Objective: BP 117/71   Pulse 68   Temp 98.7 F (37.1 C) (Oral)   Resp 17   Ht 5' (1.524 m)   Wt 73.5 kg (162 lb)   BMI 31.64 kg/m  or  Vitals:   02/19/16 1002 02/19/16 1100 02/19/16 1155 02/19/16 1258  BP: 123/78  125/66 117/71  Pulse: 80  81 68  Resp:  18 17   Temp:   98.7 F (37.1 C)   TempSrc:   Oral   Weight:      Height:        FHT: 140, mod, + accels, no decels Uterine activity: q4-235min Dilation: Fingertip Effacement (%): 50 Station: -3 Presentation: Vertex Exam by:: Colleen Hellerhristina Woods, RN and Dr. Artist PaisYoo  Labs: Lab Results  Component Value Date   WBC 9.9 02/19/2016   HGB 10.5 (L) 02/19/2016   HCT 31.2 (L) 02/19/2016   MCV 80.4 02/19/2016   PLT 329 02/19/2016    Patient Active Problem List   Diagnosis Date Noted  . Cholestasis of pregnancy in third trimester 02/19/2016  . Intrahepatic cholestasis of pregnancy, antepartum 01/12/2016  . Supervision of high risk pregnancy, antepartum 01/12/2016    Assessment / Plan: 25 y.o. G3P2002 at 4476w0d here for IOL for cholestasis  Labor: cytotec Fetal Wellbeing:  Category I Pain Control:  None, patient desired natural birth Anticipated MOD:  SVD  Leland HerElsia J Yoo, DO  PGY-1, [facility] 8/17/201712:58 PM

## 2016-02-20 ENCOUNTER — Encounter (HOSPITAL_COMMUNITY): Payer: Self-pay

## 2016-02-20 DIAGNOSIS — O2662 Liver and biliary tract disorders in childbirth: Secondary | ICD-10-CM

## 2016-02-20 DIAGNOSIS — Z3A37 37 weeks gestation of pregnancy: Secondary | ICD-10-CM

## 2016-02-20 DIAGNOSIS — K831 Obstruction of bile duct: Secondary | ICD-10-CM

## 2016-02-20 LAB — CBC
HCT: 30.1 % — ABNORMAL LOW (ref 36.0–46.0)
Hemoglobin: 10.3 g/dL — ABNORMAL LOW (ref 12.0–15.0)
MCH: 27.4 pg (ref 26.0–34.0)
MCHC: 34.2 g/dL (ref 30.0–36.0)
MCV: 80.1 fL (ref 78.0–100.0)
PLATELETS: 311 10*3/uL (ref 150–400)
RBC: 3.76 MIL/uL — AB (ref 3.87–5.11)
RDW: 14.2 % (ref 11.5–15.5)
WBC: 20.2 10*3/uL — AB (ref 4.0–10.5)

## 2016-02-20 LAB — GC/CHLAMYDIA PROBE AMP (~~LOC~~) NOT AT ARMC
Chlamydia: NEGATIVE
NEISSERIA GONORRHEA: NEGATIVE

## 2016-02-20 MED ORDER — PRENATAL MULTIVITAMIN CH
1.0000 | ORAL_TABLET | Freq: Every day | ORAL | Status: DC
  Administered 2016-02-20 – 2016-02-21 (×2): 1 via ORAL
  Filled 2016-02-20 (×2): qty 1

## 2016-02-20 MED ORDER — ZOLPIDEM TARTRATE 5 MG PO TABS: 5.0000 mg | ORAL_TABLET | Freq: Every evening | ORAL | Status: DC | PRN

## 2016-02-20 MED ORDER — WITCH HAZEL-GLYCERIN EX PADS: 1.0000 "application " | MEDICATED_PAD | CUTANEOUS | Status: DC | PRN

## 2016-02-20 MED ORDER — ONDANSETRON HCL 4 MG PO TABS: 4.0000 mg | ORAL_TABLET | ORAL | Status: DC | PRN

## 2016-02-20 MED ORDER — COCONUT OIL OIL: 1.0000 "application " | TOPICAL_OIL | Status: DC | PRN

## 2016-02-20 MED ORDER — OXYCODONE-ACETAMINOPHEN 5-325 MG PO TABS: 2.0000 | ORAL_TABLET | ORAL | Status: DC | PRN

## 2016-02-20 MED ORDER — SENNOSIDES-DOCUSATE SODIUM 8.6-50 MG PO TABS
2.0000 | ORAL_TABLET | ORAL | Status: DC
  Administered 2016-02-20: 2 via ORAL
  Filled 2016-02-20: qty 2

## 2016-02-20 MED ORDER — TETANUS-DIPHTH-ACELL PERTUSSIS 5-2.5-18.5 LF-MCG/0.5 IM SUSP: 0.5000 mL | Freq: Once | INTRAMUSCULAR | Status: DC

## 2016-02-20 MED ORDER — OXYCODONE-ACETAMINOPHEN 5-325 MG PO TABS
1.0000 | ORAL_TABLET | ORAL | Status: DC | PRN
  Administered 2016-02-20 (×4): 1 via ORAL
  Filled 2016-02-20 (×4): qty 1

## 2016-02-20 MED ORDER — ONDANSETRON HCL 4 MG/2ML IJ SOLN: 4.0000 mg | INTRAMUSCULAR | Status: DC | PRN

## 2016-02-20 MED ORDER — BENZOCAINE-MENTHOL 20-0.5 % EX AERO: 1.0000 "application " | INHALATION_SPRAY | CUTANEOUS | Status: DC | PRN

## 2016-02-20 MED ORDER — ACETAMINOPHEN 325 MG PO TABS: 650.0000 mg | ORAL_TABLET | ORAL | Status: DC | PRN

## 2016-02-20 MED ORDER — DIBUCAINE 1 % RE OINT: 1.0000 "application " | TOPICAL_OINTMENT | RECTAL | Status: DC | PRN

## 2016-02-20 MED ORDER — DIPHENHYDRAMINE HCL 25 MG PO CAPS
25.0000 mg | ORAL_CAPSULE | Freq: Four times a day (QID) | ORAL | Status: DC | PRN
  Administered 2016-02-20 – 2016-02-21 (×2): 25 mg via ORAL
  Filled 2016-02-20 (×2): qty 1

## 2016-02-20 MED ORDER — SIMETHICONE 80 MG PO CHEW: 80.0000 mg | CHEWABLE_TABLET | ORAL | Status: DC | PRN

## 2016-02-20 MED ORDER — IBUPROFEN 600 MG PO TABS
600.0000 mg | ORAL_TABLET | Freq: Four times a day (QID) | ORAL | Status: DC
  Administered 2016-02-20 – 2016-02-21 (×6): 600 mg via ORAL
  Filled 2016-02-20 (×6): qty 1

## 2016-02-20 NOTE — Progress Notes (Signed)
[facility] accompanied by husband and RN. Assisted out of wheelchair with steady gait and no dizziness to bed. Vitals WNL, color is pale, LCTA Bilat, Fundus is firm and one below the umbilicus wit hsmall rubra lochia. Oriented to room, call light, visiting hours, and paper work via Research officer, trade unionpanish interpreter. Pain scale is 1. Condition stable.

## 2016-02-20 NOTE — Progress Notes (Signed)
Patient assisted out of bed to bathroom for instructions in peri care. Voided a large amount of urine in toilt. Ambulated back to bed with steady gait and no c/o dizziness. Condition stable.

## 2016-02-20 NOTE — Lactation Note (Signed)
This note was copied from a baby's chart. Lactation Consultation Note  Patient Name: Colleen Coleman ZOXWR'UToday's Date: 02/20/2016 Reason for consult: Initial assessment;Other (Comment) (mom speaks ltd [place], request Spanish interpreter  / Eda Royal present   r )  Baby is 8413 hours old and has been exclusively breast fed so far. Baby is an Early term infant - 6237 3917 weeks old ,< 6 pounds.  So far baby has been consistent at the breast . @ this consult LC observed the baby latched for feeding on the right in football for 15 mins ,  And mom independent with latching both breast, cross cradle with left breast . Multiply swallows noted on both . LC reminded mom about skin to skin feedings. Depth achieved on both breast and per mom latches pain free.  Until the baby can stay awake for a feeding and the benefits of skin to skin enhance milk coming in.  Per mom 1st baby was born early and was small like this baby, 2nd baby was term and larger.   Mom requested a hand pump . LC reviewed set up. And per mom had used it before with a #24 Flange.  LC explained to mom we would watch the weight loss and due to being < 6 pounds would determine whether we needed a to set up a DEBP  For extra stimulation to enhance milk coming in quicker. LC  reassured  Mom, baby is feeding well.  Mother informed of post-discharge support and given phone number to the lactation department, including services for phone call assistance;  out-patient appointments; and breastfeeding support group. List of other breastfeeding resources in the community given in the handout. Encouraged  mother to call for problems or concerns related to breastfeeding.  Maternal Data    Feeding Feeding Type:  (baby latched at 1340 per mom ) Length of feed:  (LC obs feeding , still eating at 20 mins , multiply swallows)  LATCH Score/Interventions Latch: Grasps breast easily, tongue down, lips flanged, rhythmical sucking. Intervention(s):  Skin to skin;Teach feeding cues;Waking techniques  Audible Swallowing: Spontaneous and intermittent  Type of Nipple: Everted at rest and after stimulation  Comfort (Breast/Nipple): Soft / non-tender     Hold (Positioning): No assistance needed to correctly position infant at breast.  LATCH Score: 10  Lactation Tools Discussed/Used Tools: Pump Breast pump type: Manual WIC Program: Yes (per mom active with [place] [facility] ) Pump Review: Setup, frequency, and cleaning Initiated by:: MAI  Date initiated:: 02/20/16   Consult Status Consult Status: Follow-up Date: 02/21/16 Follow-up type: In-patient    [name], [name] 02/20/2016, 2:11 PM

## 2016-02-20 NOTE — Progress Notes (Signed)
UR chart review completed.  

## 2016-02-21 MED ORDER — SENNOSIDES-DOCUSATE SODIUM 8.6-50 MG PO TABS: 2.0000 | ORAL_TABLET | ORAL | 0 refills | Status: DC | PRN

## 2016-02-21 MED ORDER — IBUPROFEN 600 MG PO TABS: 600.0000 mg | ORAL_TABLET | Freq: Four times a day (QID) | ORAL | 0 refills | Status: DC

## 2016-02-21 NOTE — Discharge Instructions (Signed)
Parto vaginal despus de una cesrea (Vaginal Birth After Cesarean Delivery) Un parto vaginal despus de un parto por cesrea es dar a luz por la vagina despus de haber dado a luz por medio de una intervencin quirrgica. En el pasado, si una mujer tena un beb por cesrea, todos los partos posteriores deban hacerse por cesrea. Esto ya no es as. Puede ser seguro para la mam intentar un parto vaginal luego de una cesrea.  Es importante que converse con su mdico desde comienzos del embarazo de modo que pueda comprender los riesgos, beneficios y opciones. Le dar tiempo para decidir qu es lo mejor en su caso particular. La decisin final de tener un parto vaginal o por cesrea debe tomarse en conjunto, entre usted y el mdico. Cualquier cambio en su salud o la de su beb durante el embarazo puede ser motivo de un cambio de decisin respecto del parto vaginal.  LAS MUJERES QUE OPTAN POR EL PARTO VAGINAL, DEBEN CONSULTAR AL MDICO PARA ASEGURARSE DE QUE:  La cesrea anterior se haya realizado con un corte (incisin) uterino transversal (no con una incisin vertical clsica).  El canal de parto es lo suficientemente grande como para que pase el nio.  No ha sido sometida a otras operaciones del tero.  Durante el trabajo de parto, le realizarn un monitoreo fetal electrnico, en todo momento.  Habr un quirfano disponible y listo en caso de necesitar una cesrea de emergencia.  Un mdico y personal de quirfano estarn disponibles en todo momento durante el trabajo de parto, para realizar una cesrea en caso de ser necesario.  Habr un anestesista disponible en caso de necesitar una cesrea de emergencia.  La nursery est lista y cuenta con personal especializado y el equipo disponible para cuidar al beb en caso de emergencia. BENEFICIOS DEL PARTO VAGINAL:  Permanencia ms breve en el hospital.  Prevencin de los riesgos asociados con el parto por cesrea, por ejemplo:  Complicaciones  quirrgicas, como apertura o hernia de la incisin.  Lesiones en otros rganos.  Fiebre. Esto puede ocurrir si aparece una infeccin despus de la ciruga. Tambin puede ocurrir como reaccin a los medicamentos administrados para adormecerla durante la ciruga.  Menos prdida de sangre y menos probabilidad de necesitar una transfusin sangunea.  Menor riesgo de cogulos sanguneos e infeccin.  Tiempo ms corto de recuperacin.  Menor riesgo de remocin del tero (histerectoma).  Menor riesgo de que la placenta cubra parcial o completamente la abertura del tero (placenta previa) en embarazos futuros.  Menos riesgos en el trabajo de parto y el parto futuros. RIESGOS  Ruptura del tero. Esto ocurre en menos del 1% de los partos vaginales. El riesgo de que eso suceda es mayor si:  Se toman medidas para iniciar el proceso del trabajo de parto (inducir el parto) o estimular o intensificar las contracciones (aumentar el trabajo de parto).  Se usan medicamentos para ablandar (madurar) el cuello del tero.  Es necesario extraer el tero (histerectoma) si se rompe. No debe llevarse a cabo si:  La cesrea previa se realiz con una incisin vertical (clsica) o con forma de T, o usted no sabe cul de ellas le han practicado.  Ha sufrido ruptura del tero.  Ha tenido ciertos tipos de ciruga en el tero, como la extirpacin de fibromas uterinos. Pregntele a su mdico sobre otros tipos de cirugas que le impiden tener un parto vaginal.  Tiene ciertos problemas mdicos o relacionados con el parto (obsttricos).  El beb est en   problemas.  Tuvo dos cesreas previas y ningn parto vaginal. OTRAS COSAS QUE DEBE SABER:  La anestesia peridural es segura.  Es seguro dar vuelta al beb si se encuentra de nalgas (intentar una versin ceflica externa).  Es seguro intentarlo en caso de mellizos.  El parto vaginal puede no ser apropiado si el beb pesa 8,8lb (4kg) o ms. Sin embargo,  las predicciones de peso no son siempre exactas y no deben ser lo nico a tenerse en cuenta para decidir si el parto vaginal es lo indicado para usted.  Hay aumento en el porcentaje de fracasos si el intervalo entre la cesrea y el parto vaginal es de menos de 19 meses.  Su mdico puede aconsejarle no tener un parto vaginal si tiene preeclampsia (hipertensin, protena en la orina e hinchazn en la cara y las extremidades).  El parto vaginal suele ser exitoso si ya tuvo un parto vaginal previamente.  Tambin suele ser exitoso cuando el trabajo de parto comienza espontneamente antes de la fecha.  El parto vaginal despus de una cesrea es similar a un parto espontneo vaginal normal.   Esta informacin no tiene como fin reemplazar el consejo del mdico. Asegrese de hacerle al mdico cualquier pregunta que tenga.   Document Released: 12/08/2007 Document Revised: 04/11/2013 Elsevier Interactive Patient Education 2016 Elsevier Inc.  

## 2016-02-21 NOTE — Progress Notes (Addendum)
Post Partum Day 1  Subjective:  Colleen Coleman is a 25 y.o. G3P3003 1541w1d s/p SVD.  No acute events overnight.  Pt denies problems with ambulating, voiding or po intake.  She denies nausea or vomiting.  Pain is well controlled.  She has had flatus. She has had bowel movement.  Lochia Minimal.  Plan for birth control is Depo-Provera.  Method of Feeding: {Breast  Objective: BP (!) 92/59 (BP Location: Right Arm)   Pulse 65   Temp 98.1 F (36.7 C) (Oral)   Resp 18   Ht 5' (1.524 m)   Wt 73.5 kg (162 lb)   SpO2 99%   Breastfeeding? Unknown   BMI 31.64 kg/m   Physical Exam:  General: alert, cooperative and no distress Lochia:normal flow Chest: CTAB Heart: RRR no m/r/g Abdomen: +BS, soft, nontender, fundus firm at/below umbilicus Uterine Fundus: firm,  DVT Evaluation: No evidence of DVT seen on physical exam. Extremities: no edema   Recent Labs  02/19/16 0820 02/20/16 0328  HGB 10.5* 10.3*  HCT 31.2* 30.1*    Assessment/Plan:  ASSESSMENT: Colleen Coleman is a 25 y.o. G3P3003 7241w1d ppd #1 s/p NSVD Plan for discharge tomorrow   LOS: 2 days   Asiyah Z Mikell 02/21/2016, 8:57 AM   OB FELLOW POSTPARTUM PROGRESS NOTE ATTESTATION  I have seen and examined this patient and agree with above documentation in the resident's note.   Ernestina PennaNicholas Garrus Gauthreaux, MD 10:19 AM
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
## 2016-02-21 NOTE — Discharge Summary (Signed)
OB Discharge Summary     Patient Name: Colleen Coleman DOB: 09/22/1990 MRN: [id number]  Date of admission: 02/19/2016 Delivering MD: Willodean RosenthalHARRAWAY-SMITH, CAROLYN   Date of discharge: 02/21/2016  Admitting diagnosis: INDUCTION Intrauterine pregnancy: 226w1d     Secondary diagnosis:  Active Problems:   Cholestasis of pregnancy in third trimester  Additional problems: none     Discharge diagnosis: Term Pregnancy Delivered                                                                                                Post partum procedures:none  Augmentation: AROM, Cytotec and Foley Balloon  Complications: None  Hospital course:  Induction of Labor With Vaginal Delivery   25 y.o. yo G3P3003 at 6926w1d was admitted to the hospital 02/19/2016 for induction of labor.  Indication for induction: Cholestasis of pregnancy.  Patient had an uncomplicated labor course as follows: Membrane Rupture Time/Date: 12:39 AM ,02/20/2016   Intrapartum Procedures: Episiotomy: None [1]                                         Lacerations:  None [1]  Patient had delivery of a Viable infant.  Information for the patient's newborn:  Doristine Churchde Coleman, Girl Jerlyn LyYurby [[id number]][[id number]]  Delivery Method: Vaginal, Spontaneous Delivery (Filed from Delivery Summary)   02/20/2016  Details of delivery can be found in separate delivery note.  Patient had a routine postpartum course. Patient is discharged home 02/21/16.   Physical exam Vitals:   02/20/16 0359 02/20/16 0900 02/20/16 1800 02/21/16 0510  BP: (!) 103/44 (!) 97/41 (!) 100/51 (!) 92/59  Pulse: 86 98 71 65  Resp: 18 18 18 18   Temp: 98.8 F (37.1 C) 98.6 F (37 C) 98 F (36.7 C) 98.1 F (36.7 C)  TempSrc: Oral Oral Oral Oral  SpO2: 99%     Weight:      Height:       General: alert, cooperative and no distress Lochia: appropriate Uterine Fundus: firm DVT Evaluation: No evidence of DVT seen on physical exam. Labs: Lab Results  Component Value Date   WBC  20.2 (H) 02/20/2016   HGB 10.3 (L) 02/20/2016   HCT 30.1 (L) 02/20/2016   MCV 80.1 02/20/2016   PLT 311 02/20/2016   CMP Latest Ref Rng & Units 12/19/2015  Glucose 65 - 99 mg/dL 98  BUN 6 - 20 mg/dL <7(W<5(L)  Creatinine 2.950.44 - 1.00 mg/dL 6.21(H0.38(L)  Sodium 086135 - 578145 mmol/L 134(L)  Potassium 3.5 - 5.1 mmol/L 3.7  Chloride 101 - 111 mmol/L 104  CO2 22 - 32 mmol/L 24  Calcium 8.9 - 10.3 mg/dL 4.6(N8.8(L)  Total Protein 6.5 - 8.1 g/dL 6.8  Total Bilirubin 0.3 - 1.2 mg/dL 0.4  Alkaline Phos 38 - 126 U/L 133(H)  AST 15 - 41 U/L 30  ALT 14 - 54 U/L 39    Discharge instruction: per After Visit Summary and "Baby and Me Booklet".  After visit meds:    Medication List  STOP taking these medications   diphenhydrAMINE 25 mg capsule Commonly known as:  BENADRYL   hydrOXYzine 25 MG tablet Commonly known as:  ATARAX/VISTARIL   ursodiol 300 MG capsule Commonly known as:  ACTIGALL     TAKE these medications   acetaminophen 325 MG tablet Commonly known as:  TYLENOL Take 325 mg by mouth every 6 (six) hours as needed (pain). Reported on 01/15/2016   ibuprofen 600 MG tablet Commonly known as:  ADVIL,MOTRIN Take 1 tablet (600 mg total) by mouth every 6 (six) hours.   prenatal multivitamin Tabs tablet Take 1 tablet by mouth daily at 12 noon.   senna-docusate 8.6-50 MG tablet Commonly known as:  Senokot-S Take 2 tablets by mouth as needed for mild constipation.       Diet: routine diet  Activity: Advance as tolerated. Pelvic rest for 6 weeks.   Outpatient follow up:6 weeks Follow up Appt: Future Appointments Date Time Provider Department Center  03/24/2016 1:40 PM Aviva SignsMarie L Williams, CNM [facility]   Follow up Visit: Follow-up Information    [facility] Schedule an appointment as soon as possible for a visit in 6 week(s).   Specialty:  Obstetrics and Gynecology Why:  for postpartum visit Contact information: [address] [place]  [phone]       [facility] .   Why:  as needed for any potential emergencies Contact information: [address] [place] [phone]         Postpartum contraception: Depo Provera  Newborn Data: Live born female  Birth Weight: 5 lb 10.7 oz (2571 g) APGAR: 9, 9  Baby Feeding: Breast Disposition:home with mother   02/21/2016 Leland HerElsia J Yoo, DO PGY-1  I spoke with and examined patient and agree with resident/PA/SNM's note and plan of care.  Cheral MarkerKimberly R. Ubaldo Daywalt, CNM, WHNP-BC 02/21/2016 1:22 PM

## 2016-03-24 ENCOUNTER — Ambulatory Visit: Payer: Self-pay | Admitting: Advanced Practice Midwife

## 2016-04-06 ENCOUNTER — Ambulatory Visit (INDEPENDENT_AMBULATORY_CARE_PROVIDER_SITE_OTHER): Payer: Self-pay | Admitting: Advanced Practice Midwife

## 2016-04-06 ENCOUNTER — Encounter: Payer: Self-pay | Admitting: Advanced Practice Midwife

## 2016-04-06 VITALS — BP 100/69 | HR 68 | Ht 59.0 in | Wt 154.0 lb

## 2016-04-06 DIAGNOSIS — Z23 Encounter for immunization: Secondary | ICD-10-CM

## 2016-04-06 NOTE — Progress Notes (Signed)
Subjective:     Colleen Coleman is a 25 y.o. female who presents for a postpartum visit. She is 6 weeks postpartum following a spontaneous vaginal delivery. I have fully reviewed the prenatal and intrapartum course. The delivery was at 37.1 gestational weeks. Outcome: spontaneous vaginal delivery. Anesthesia: none. Postpartum course has been unremarkable. Baby's course has been unremarkable. Baby is feeding by breast. Bleeding no bleeding. Bowel function is normal. Bladder function is normal. Patient is not sexually active. Contraception method is none. Patient is interested in Depo Provera Injections. Depression/Anxiety screening: negative.  Video Interpreter [phone] used for check in  The following portions of the patient's history were reviewed and updated as appropriate: allergies, current medications, past family history, past medical history, past social history, past surgical history and problem list.  Review of Systems Pertinent items noted in HPI and remainder of comprehensive ROS otherwise negative.   Objective:     BP 100/69   Pulse 68   Ht 4\' 11"  (1.499 m)   Wt 154 lb (69.9 kg)   Breastfeeding? Yes   BMI 31.10 kg/m    VS reviewed, nursing note reviewed,  Constitutional: well developed, well nourished, no distress HEENT: normocephalic CV: normal rate Pulm/chest wall: normal effort Abdomen: soft Neuro: alert and oriented x 3 Skin: warm, dry Psych: affect normal      Assessment:     Normal postpartum exam. Contraceptive counseling    Plan:    1. Contraception: Depo-Provera injections.  [facility] 2. Pt has appt at health dept on Friday. 3. Follow up in: 1 year or as needed.

## 2018-07-05 NOTE — L&D Delivery Note (Signed)
Patient: Colleen Coleman MRN: [id number]  GBS status: Negative, IAP given: None   Patient is a 28 y.o. now G4P4 s/p NSVD at [redacted]w[redacted]d, who was admitted for IOL for cholestasis. SROM 3h 3m prior to delivery with clear fluid.    Delivery Note At 8:22 PM a viable female was delivered via Vaginal, Spontaneous (Presentation: OA).  APGAR: 9,9 ; weight pending.   Placenta status: spontaneous, intact.  Cord: 3 vessel with the following complications: none.  Anesthesia:  Epidural  Episiotomy: None Lacerations: None Suture Repair: N/A  Est. Blood Loss (mL): 100  Head delivered OA. No nuchal cord present. Shoulder and body delivered in usual fashion. Infant with spontaneous cry, placed on mother's abdomen, dried and bulb suctioned. Cord clamped x 2 after 1-minute delay, and cut by family member. Cord blood drawn. Placenta delivered spontaneously with gentle cord traction. Fundus firm with massage and Pitocin. Perineum inspected and found to have no lacerations.  Mom to postpartum.  Baby to Couplet care / Skin to Skin.  Melina Schools 02/18/2019, 8:47 PM

## 2018-09-11 LAB — OB RESULTS CONSOLE GC/CHLAMYDIA
Chlamydia: NEGATIVE
Gonorrhea: NEGATIVE

## 2018-09-11 LAB — OB RESULTS CONSOLE HEPATITIS B SURFACE ANTIGEN: Hepatitis B Surface Ag: NEGATIVE

## 2018-09-11 LAB — OB RESULTS CONSOLE HGB/HCT, BLOOD
HCT: 38 (ref 29–41)
Hemoglobin: 12.6

## 2018-09-11 LAB — GLUCOSE, 1 HOUR: GLUCOSE 1 HOUR GTT: 71 (ref ?–200)

## 2018-09-11 LAB — OB RESULTS CONSOLE RPR: RPR: NONREACTIVE

## 2018-09-11 LAB — OB RESULTS CONSOLE HIV ANTIBODY (ROUTINE TESTING): HIV: NONREACTIVE

## 2018-09-11 LAB — CULTURE, OB URINE: URINE CULTURE, OB: NEGATIVE

## 2018-09-11 LAB — CYTOLOGY - PAP: Pap: NEGATIVE

## 2018-09-11 LAB — OB RESULTS CONSOLE PLATELET COUNT: Platelets: 310

## 2018-09-11 LAB — OB RESULTS CONSOLE RUBELLA ANTIBODY, IGM: Rubella: NON-IMMUNE/NOT IMMUNE

## 2018-10-05 ENCOUNTER — Ambulatory Visit (INDEPENDENT_AMBULATORY_CARE_PROVIDER_SITE_OTHER): Payer: Self-pay | Admitting: Family Medicine

## 2018-10-05 ENCOUNTER — Encounter: Payer: Self-pay | Admitting: Family Medicine

## 2018-10-05 ENCOUNTER — Telehealth: Payer: Self-pay | Admitting: Family Medicine

## 2018-10-05 ENCOUNTER — Other Ambulatory Visit: Payer: Self-pay

## 2018-10-05 VITALS — BP 118/65 | HR 87 | Wt 168.0 lb

## 2018-10-05 DIAGNOSIS — K831 Obstruction of bile duct: Secondary | ICD-10-CM

## 2018-10-05 DIAGNOSIS — O9989 Other specified diseases and conditions complicating pregnancy, childbirth and the puerperium: Secondary | ICD-10-CM

## 2018-10-05 DIAGNOSIS — O26613 Liver and biliary tract disorders in pregnancy, third trimester: Principal | ICD-10-CM

## 2018-10-05 DIAGNOSIS — Z8632 Personal history of gestational diabetes: Secondary | ICD-10-CM

## 2018-10-05 DIAGNOSIS — O26643 Intrahepatic cholestasis of pregnancy, third trimester: Secondary | ICD-10-CM

## 2018-10-05 DIAGNOSIS — Z8759 Personal history of other complications of pregnancy, childbirth and the puerperium: Secondary | ICD-10-CM

## 2018-10-05 DIAGNOSIS — Z3A18 18 weeks gestation of pregnancy: Secondary | ICD-10-CM

## 2018-10-05 DIAGNOSIS — M549 Dorsalgia, unspecified: Secondary | ICD-10-CM

## 2018-10-05 DIAGNOSIS — O26612 Liver and biliary tract disorders in pregnancy, second trimester: Secondary | ICD-10-CM

## 2018-10-05 DIAGNOSIS — O0990 Supervision of high risk pregnancy, unspecified, unspecified trimester: Secondary | ICD-10-CM

## 2018-10-05 MED ORDER — URSODIOL 300 MG PO CAPS: 300.0000 mg | ORAL_CAPSULE | Freq: Three times a day (TID) | ORAL | 3 refills | Status: DC

## 2018-10-05 MED ORDER — ASPIRIN EC 81 MG PO TBEC: 81.0000 mg | DELAYED_RELEASE_TABLET | Freq: Every day | ORAL | 2 refills | Status: DC

## 2018-10-05 MED ORDER — CYCLOBENZAPRINE HCL 10 MG PO TABS: 10.0000 mg | ORAL_TABLET | Freq: Three times a day (TID) | ORAL | 2 refills | Status: AC | PRN

## 2018-10-05 NOTE — Telephone Encounter (Signed)
Called the patient with an interupter id [phone]  to explain the COVID19 restrictions. No questions at this time.

## 2018-10-05 NOTE — Progress Notes (Signed)
Subjective:  Colleen Coleman is a [id number] [redacted]w[redacted]d by LMP, she is being seen today for her first obstetrical visit. She was referred to our office from [facility] due to cholestasis. She was having generalized itching, worse on palms and feet. Her obstetrical history is significant for cholestasis, GHTN and GDM diet controlled.. Patient does intend to breast feed. Pregnancy history fully reviewed.  Patient reports backache.  BP 118/65   Pulse 87   Wt 168 lb (76.2 kg)   LMP 05/30/2018 (Approximate)   BMI 33.93 kg/m   HISTORY: OB History  Gravida Para Term Preterm AB Living  4 3 3     3   SAB TAB Ectopic Multiple Live Births        0 3    # Outcome Date GA Lbr Len/2nd Weight Sex Delivery Anes PTL Lv  4 Current           3 Term 02/20/16 [redacted]w[redacted]d 05:40 / 00:01 5 lb 10.7 oz (2.571 kg) F Vag-Spont None  LIV  2 Term 12/25/12 [redacted]w[redacted]d 03:33 / 00:10 6 lb 0.8 oz (2.745 kg) F Vag-Spont None  LIV     Complications: Cholestasis of pregnancy  1 Term 2010 [redacted]w[redacted]d  5 lb 8 oz (2.495 kg) F Vag-Spont   LIV    Past Medical History:  Diagnosis Date  . Cholelithiasis complicating pregnancy in third trimester, antepartum   . Depression   . Hx of chlamydia infection   . Hyperemesis arising during pregnancy   . Obesity (BMI 30.0-34.9)   . Supervision of high risk pregnancy, antepartum 01/12/2016    Clinic  [facility] Prenatal Labs Dating  Blood type: A/Positive/-- (03/27 0000) A+ Genetic Screen 1 Screen:    AFP:     Quad:     NIPS: Antibody:Negative (03/27 0000) Anatomic [place] At HD nl Rubella: Immune (03/27 0000) GTT Early:               Third trimester:  RPR: Nonreactive (06/19 0000)  Flu vaccine  09/2015) HBsAg: Negative (03/27 0000)  TDaP vaccine     Re eived                                     . Varicose veins    back of both legs    Past Surgical History:  Procedure Laterality Date  . NO PAST SURGERIES      Family History  Problem Relation Age of Onset  . Other Mother        varicosities  . Hypertension  Sister   . Other Sister        varicosities  . Depression Sister      Exam  BP 118/65   Pulse 87   Wt 168 lb (76.2 kg)   LMP 05/30/2018 (Approximate)   BMI 33.93 kg/m   CONSTITUTIONAL: Well-developed, well-nourished female in no acute distress.  ABDOMEN: Soft, normal bowel sounds, no distention noted.  No tenderness, rebound or guarding.  MUSCULOSKELETAL: Normal range of motion. No tenderness.  No cyanosis, clubbing, or edema.  2+ distal pulses. Lumbar spasm. SKIN: Skin is warm and dry. No rash noted. Not diaphoretic. No erythema. No pallor. NEUROLOGIC: Alert and oriented to person, place, and time. Normal reflexes, muscle tone coordination. No cranial nerve deficit noted. PSYCHIATRIC: Normal mood and affect. Normal behavior. Normal judgment and thought content.    Assessment:    Pregnancy: [id number] Patient Active Problem  List   Diagnosis Date Noted  . Supervision of high risk pregnancy, antepartum 10/05/2018  . History of gestational diabetes 10/05/2018  . History of gestational hypertension 10/05/2018  . Cholestasis of pregnancy in third trimester 02/19/2016  . Intrahepatic cholestasis of pregnancy, antepartum 01/12/2016      Plan:   1. Supervision of high risk pregnancy, antepartum FHT and FH normal.  2. Cholestasis of pregnancy in third trimester Discussed serial growth [place]. Patient does not have insurance, so will obtain at [facility].  3. History of gestational diabetes  4. History of gestational hypertension CMP, CBC already done. UP:C today.  5. Back pain in pregnancy Flexeril prescribed     Problem list reviewed and updated. 75% of 30 min visit spent on counseling and coordination of care.     Levie Heritage 10/05/2018

## 2018-10-05 NOTE — Addendum Note (Signed)
Addended by: Henrietta Dine on: 10/05/2018 03:56 PM   Modules accepted: Orders

## 2018-10-05 NOTE — Progress Notes (Signed)
Order placed for baby scripts optimization.  Pt reports she does not have access to a cuff at home.  Pt given a non baby scripts cuff.   Pt set up babyscripts account and downloaded the app successfully and logged in.  Pt account manually changed so that pt could manually pt in BP.  Pt verbalized and demonstrated understanding of all teaching, including use of the app, proper use of the BP cuff, proper positioning for taking BP and how to enter BP.     

## 2018-10-09 ENCOUNTER — Encounter (HOSPITAL_COMMUNITY): Payer: Self-pay

## 2018-10-10 ENCOUNTER — Encounter: Payer: Self-pay | Admitting: *Deleted

## 2018-10-10 ENCOUNTER — Encounter: Payer: Self-pay | Admitting: Emergency Medicine

## 2018-10-18 ENCOUNTER — Ambulatory Visit (HOSPITAL_COMMUNITY): Payer: Self-pay

## 2018-10-26 MED FILL — URSODIOL 300 MG CAPSULE: 300 | 30 days supply | Qty: 90 | Fill #0

## 2018-10-26 MED FILL — CYCLOBENZAPRINE HCL 10 MG T: 10 | 10 days supply | Qty: 30 | Fill #0

## 2018-10-31 ENCOUNTER — Telehealth: Payer: Self-pay | Admitting: General Practice

## 2018-10-31 NOTE — Telephone Encounter (Signed)
Called patient with [employer] [phone] regarding appt tomorrow and downloading [employer] webex app, no answer- left message stating we were calling about her appt tomorrow and downloading an app for the video portion, please call back.

## 2018-11-01 ENCOUNTER — Encounter: Payer: Self-pay | Admitting: Obstetrics and Gynecology

## 2018-11-01 ENCOUNTER — Ambulatory Visit (INDEPENDENT_AMBULATORY_CARE_PROVIDER_SITE_OTHER): Payer: Self-pay | Admitting: Obstetrics and Gynecology

## 2018-11-01 ENCOUNTER — Other Ambulatory Visit: Payer: Self-pay

## 2018-11-01 DIAGNOSIS — O26612 Liver and biliary tract disorders in pregnancy, second trimester: Secondary | ICD-10-CM

## 2018-11-01 DIAGNOSIS — R740 Nonspecific elevation of levels of transaminase and lactic acid dehydrogenase [LDH]: Secondary | ICD-10-CM

## 2018-11-01 DIAGNOSIS — R7401 Elevation of levels of liver transaminase levels: Secondary | ICD-10-CM | POA: Insufficient documentation

## 2018-11-01 DIAGNOSIS — K831 Obstruction of bile duct: Secondary | ICD-10-CM

## 2018-11-01 NOTE — Progress Notes (Signed)
TELEHEALTH VIRTUAL OBSTETRICS VISIT ENCOUNTER NOTE  I connected on 11/01/18 at  3:35 PM EDT by telephone at home and verified that I am speaking with the correct person using two identifiers.   I discussed the limitations, risks, security and privacy concerns of performing an evaluation and management service by telephone and the availability of in person appointments. I also discussed with the patient that there may be a patient responsible charge related to this service. The patient expressed understanding and agreed to proceed.  Subjective:  Colleen Coleman is a 28 y.o. G4P3003 at [redacted]w[redacted]d being followed for ongoing prenatal care.  She is currently monitored for the following issues for this high-risk pregnancy and has Cholestasis during pregnancy; Supervision of high risk pregnancy, antepartum; History of gestational diabetes; History of gestational hypertension; and ALT (SGPT) level raised on their problem list.  Patient reports no complaints. Reports fetal movement. Denies any contractions, bleeding or leaking of fluid.   The following portions of the patient's history were reviewed and updated as appropriate: allergies, current medications, past family history, past medical history, past social history, past surgical history and problem list.   Objective:  There were no vitals filed for this visit.  General:  Alert, oriented and cooperative.   Mental Status: Normal mood and affect perceived. Normal judgment and thought content.  Rest of physical exam deferred due to type of encounter  Assessment and Plan:  Pregnancy: G4P3003 at [redacted]w[redacted]d 1. Cholestasis during pregnancy in second trimester Itching much improved on the actigall. Pt states she had [facility] growth u/s on 4/2. Will ask front desk to retrieve. Long d/w pt re: ICP. I told her that there aren't concrete guidelines especially when dx this early on. I told her that the pathophysiology of bile acids seems to indicate that the issue  is sudden and that AP testing doesn't necessarily indicate fetal status. I also told her that some providers will follow bile acid levels. In light of this, I told her that I recommend she have a one time mfm consult for management and delivery planning, which she is amenable to.  - AMB referral to maternal fetal medicine  2. Lethargy I told her that I can't assess without an in person visit. I told her if s/s are persisting when she gets the mfm consult in the next 7-10d to let us know and we can see her for a work in visit.   3. Elevated ALT See above. At least repeat cmp with 28wk labs  Interpreter used.   Preterm labor symptoms and general obstetric precautions including but not limited to vaginal bleeding, contractions, leaking of fluid and fetal movement were reviewed in detail with the patient.  I discussed the assessment and treatment plan with the patient. The patient was provided an opportunity to ask questions and all were answered. The patient agreed with the plan and demonstrated an understanding of the instructions. The patient was advised to call back or seek an in-person office evaluation/go to [facility] at [facility] for any urgent or concerning symptoms. Please refer to After Visit Summary for other counseling recommendations.   I provided 25 minutes of non-face-to-face time during this encounter. The visit was conducted via Webex-medicine  Return in about 1 month (around 12/01/2018) for hrob in person visit. 2h GTT and 28wk labs.  Future Appointments  Date Time Provider Department Center  12/01/2018  8:50 AM [facility]  12/01/2018 10:15 AM Hermina Staggers, MD [facility] WOC   Bingharlie Ibrohim Simmers, MD [facility]

## 2018-11-01 NOTE — Progress Notes (Signed)
[employer] [phone]

## 2018-11-30 ENCOUNTER — Telehealth: Payer: Self-pay

## 2018-11-30 NOTE — Telephone Encounter (Signed)
Called pt with [employer] to advised for appointment on 12/05/2018 @9am  for MFM consult. Interpreter LVM informing of appointment and address.

## 2018-12-01 ENCOUNTER — Other Ambulatory Visit: Payer: Self-pay

## 2018-12-01 ENCOUNTER — Ambulatory Visit (INDEPENDENT_AMBULATORY_CARE_PROVIDER_SITE_OTHER): Payer: Self-pay | Admitting: Obstetrics and Gynecology

## 2018-12-01 ENCOUNTER — Encounter: Payer: Self-pay | Admitting: Obstetrics and Gynecology

## 2018-12-01 VITALS — BP 108/70 | HR 71 | Temp 98.3°F | Wt 168.9 lb

## 2018-12-01 DIAGNOSIS — O26612 Liver and biliary tract disorders in pregnancy, second trimester: Secondary | ICD-10-CM

## 2018-12-01 DIAGNOSIS — O0992 Supervision of high risk pregnancy, unspecified, second trimester: Secondary | ICD-10-CM

## 2018-12-01 DIAGNOSIS — K831 Obstruction of bile duct: Secondary | ICD-10-CM

## 2018-12-01 DIAGNOSIS — Z23 Encounter for immunization: Secondary | ICD-10-CM

## 2018-12-01 DIAGNOSIS — O0990 Supervision of high risk pregnancy, unspecified, unspecified trimester: Secondary | ICD-10-CM

## 2018-12-01 NOTE — Progress Notes (Signed)
Pt states in the last 20 mins since blood was drawn, has been having shortness of breath and left arms hurts.

## 2018-12-01 NOTE — Patient Instructions (Signed)
Third Trimester of Pregnancy The third trimester is from week 28 through week 40 (months 7 through 9). The third trimester is a time when the unborn baby (fetus) is growing rapidly. At the end of the ninth month, the fetus is about 20 inches in length and weighs 6-10 pounds. Body changes during your third trimester Your body will continue to go through many changes during pregnancy. The changes vary from woman to woman. During the third trimester:  Your weight will continue to increase. You can expect to gain 25-35 pounds (11-16 kg) by the end of the pregnancy.  You may begin to get stretch marks on your hips, abdomen, and breasts.  You may urinate more often because the fetus is moving lower into your pelvis and pressing on your bladder.  You may develop or continue to have heartburn. This is caused by increased hormones that slow down muscles in the digestive tract.  You may develop or continue to have constipation because increased hormones slow digestion and cause the muscles that push waste through your intestines to relax.  You may develop hemorrhoids. These are swollen veins (varicose veins) in the rectum that can itch or be painful.  You may develop swollen, bulging veins (varicose veins) in your legs.  You may have increased body aches in the pelvis, back, or thighs. This is due to weight gain and increased hormones that are relaxing your joints.  You may have changes in your hair. These can include thickening of your hair, rapid growth, and changes in texture. Some women also have hair loss during or after pregnancy, or hair that feels dry or thin. Your hair will most likely return to normal after your baby is born.  Your breasts will continue to grow and they will continue to become tender. A yellow fluid (colostrum) may leak from your breasts. This is the first milk you are producing for your baby.  Your belly button may stick out.  You may notice more swelling in your hands,  face, or ankles.  You may have increased tingling or numbness in your hands, arms, and legs. The skin on your belly may also feel numb.  You may feel short of breath because of your expanding uterus.  You may have more problems sleeping. This can be caused by the size of your belly, increased need to urinate, and an increase in your body's metabolism.  You may notice the fetus "dropping," or moving lower in your abdomen (lightening).  You may have increased vaginal discharge.  You may notice your joints feel loose and you may have pain around your pelvic bone. What to expect at prenatal visits You will have prenatal exams every 2 weeks until week 36. Then you will have weekly prenatal exams. During a routine prenatal visit:  You will be weighed to make sure you and the baby are growing normally.  Your blood pressure will be taken.  Your abdomen will be measured to track your baby's growth.  The fetal heartbeat will be listened to.  Any test results from the previous visit will be discussed.  You may have a cervical check near your due date to see if your cervix has softened or thinned (effaced).  You will be tested for Group B streptococcus. This happens between 35 and 37 weeks. Your health care provider may ask you:  What your birth plan is.  How you are feeling.  If you are feeling the baby move.  If you have had any abnormal   symptoms, such as leaking fluid, bleeding, severe headaches, or abdominal cramping.  If you are using any tobacco products, including cigarettes, chewing tobacco, and electronic cigarettes.  If you have any questions. Other tests or screenings that may be performed during your third trimester include:  Blood tests that check for low iron levels (anemia).  Fetal testing to check the health, activity level, and growth of the fetus. Testing is done if you have certain medical conditions or if there are problems during the pregnancy.  Nonstress test  (NST). This test checks the health of your baby to make sure there are no signs of problems, such as the baby not getting enough oxygen. During this test, a belt is placed around your belly. The baby is made to move, and its heart rate is monitored during movement. What is false labor? False labor is a condition in which you feel small, irregular tightenings of the muscles in the womb (contractions) that usually go away with rest, changing position, or drinking water. These are called Braxton Hicks contractions. Contractions may last for hours, days, or even weeks before true labor sets in. If contractions come at regular intervals, become more frequent, increase in intensity, or become painful, you should see your health care provider. What are the signs of labor?  Abdominal cramps.  Regular contractions that start at 10 minutes apart and become stronger and more frequent with time.  Contractions that start on the top of the uterus and spread down to the lower abdomen and back.  Increased pelvic pressure and dull back pain.  A watery or bloody mucus discharge that comes from the vagina.  Leaking of amniotic fluid. This is also known as your "water breaking." It could be a slow trickle or a gush. Let your health care provider know if it has a color or strange odor. If you have any of these signs, call your health care provider right away, even if it is before your due date. Follow these instructions at home: Medicines  Follow your health care provider's instructions regarding medicine use. Specific medicines may be either safe or unsafe to take during pregnancy.  Take a prenatal vitamin that contains at least 600 micrograms (mcg) of folic acid.  If you develop constipation, try taking a stool softener if your health care provider approves. Eating and drinking   Eat a balanced diet that includes fresh fruits and vegetables, whole grains, good sources of protein such as meat, eggs, or tofu,  and low-fat dairy. Your health care provider will help you determine the amount of weight gain that is right for you.  Avoid raw meat and uncooked cheese. These carry germs that can cause birth defects in the baby.  If you have low calcium intake from food, talk to your health care provider about whether you should take a daily calcium supplement.  Eat four or five small meals rather than three large meals a day.  Limit foods that are high in fat and processed sugars, such as fried and sweet foods.  To prevent constipation: ? Drink enough fluid to keep your urine clear or pale yellow. ? Eat foods that are high in fiber, such as fresh fruits and vegetables, whole grains, and beans. Activity  Exercise only as directed by your health care provider. Most women can continue their usual exercise routine during pregnancy. Try to exercise for 30 minutes at least 5 days a week. Stop exercising if you experience uterine contractions.  Avoid heavy lifting.  Do   not exercise in extreme heat or humidity, or at high altitudes.  Wear low-heel, comfortable shoes.  Practice good posture.  You may continue to have sex unless your health care provider tells you otherwise. Relieving pain and discomfort  Take frequent breaks and rest with your legs elevated if you have leg cramps or low back pain.  Take warm sitz baths to soothe any pain or discomfort caused by hemorrhoids. Use hemorrhoid cream if your health care provider approves.  Wear a good support bra to prevent discomfort from breast tenderness.  If you develop varicose veins: ? Wear support pantyhose or compression stockings as told by your healthcare provider. ? Elevate your feet for 15 minutes, 3-4 times a day. Prenatal care  Write down your questions. Take them to your prenatal visits.  Keep all your prenatal visits as told by your health care provider. This is important. Safety  Wear your seat belt at all times when driving.  Make  a list of emergency phone numbers, including numbers for family, friends, the hospital, and police and fire departments. General instructions  Avoid cat litter boxes and soil used by cats. These carry germs that can cause birth defects in the baby. If you have a cat, ask someone to clean the litter box for you.  Do not travel far distances unless it is absolutely necessary and only with the approval of your health care provider.  Do not use hot tubs, steam rooms, or saunas.  Do not drink alcohol.  Do not use any products that contain nicotine or tobacco, such as cigarettes and e-cigarettes. If you need help quitting, ask your health care provider.  Do not use any medicinal herbs or unprescribed drugs. These chemicals affect the formation and growth of the baby.  Do not douche or use tampons or scented sanitary pads.  Do not cross your legs for long periods of time.  To prepare for the arrival of your baby: ? Take prenatal classes to understand, practice, and ask questions about labor and delivery. ? Make a trial run to the hospital. ? Visit the hospital and tour the maternity area. ? Arrange for maternity or paternity leave through employers. ? Arrange for family and friends to take care of pets while you are in the hospital. ? Purchase a rear-facing car seat and make sure you know how to install it in your car. ? Pack your hospital bag. ? Prepare the baby's nursery. Make sure to remove all pillows and stuffed animals from the baby's crib to prevent suffocation.  Visit your dentist if you have not gone during your pregnancy. Use a soft toothbrush to brush your teeth and be gentle when you floss. Contact a health care provider if:  You are unsure if you are in labor or if your water has broken.  You become dizzy.  You have mild pelvic cramps, pelvic pressure, or nagging pain in your abdominal area.  You have lower back pain.  You have persistent nausea, vomiting, or  diarrhea.  You have an unusual or bad smelling vaginal discharge.  You have pain when you urinate. Get help right away if:  Your water breaks before 37 weeks.  You have regular contractions less than 5 minutes apart before 37 weeks.  You have a fever.  You are leaking fluid from your vagina.  You have spotting or bleeding from your vagina.  You have severe abdominal pain or cramping.  You have rapid weight loss or weight gain.  You have   shortness of breath with chest pain.  You notice sudden or extreme swelling of your face, hands, ankles, feet, or legs.  Your baby makes fewer than 10 movements in 2 hours.  You have severe headaches that do not go away when you take medicine.  You have vision changes. Summary  The third trimester is from week 28 through week 40, months 7 through 9. The third trimester is a time when the unborn baby (fetus) is growing rapidly.  During the third trimester, your discomfort may increase as you and your baby continue to gain weight. You may have abdominal, leg, and back pain, sleeping problems, and an increased need to urinate.  During the third trimester your breasts will keep growing and they will continue to become tender. A yellow fluid (colostrum) may leak from your breasts. This is the first milk you are producing for your baby.  False labor is a condition in which you feel small, irregular tightenings of the muscles in the womb (contractions) that eventually go away. These are called Braxton Hicks contractions. Contractions may last for hours, days, or even weeks before true labor sets in.  Signs of labor can include: abdominal cramps; regular contractions that start at 10 minutes apart and become stronger and more frequent with time; watery or bloody mucus discharge that comes from the vagina; increased pelvic pressure and dull back pain; and leaking of amniotic fluid. This information is not intended to replace advice given to you by your  health care provider. Make sure you discuss any questions you have with your health care provider. Document Released: 06/15/2001 Document Revised: 07/27/2016 Document Reviewed: 07/27/2016 Elsevier Interactive Patient Education  2019 [employer]  

## 2018-12-01 NOTE — Progress Notes (Signed)
Subjective:  Colleen Coleman is a 28 y.o. [phone] at [redacted]w[redacted]d being seen today for ongoing prenatal care.  She is currently monitored for the following issues for this high-risk pregnancy and has Cholestasis during pregnancy; Supervision of high risk pregnancy, antepartum; History of gestational diabetes; History of gestational hypertension; and ALT (SGPT) level raised on their problem list.  Patient reports itching has improved. Felt strange after drinking glucola and blood draw . Feels fine now..  Contractions: Irritability. Vag. Bleeding: None.  Movement: Present. Denies leaking of fluid.   The following portions of the patient's history were reviewed and updated as appropriate: allergies, current medications, past family history, past medical history, past social history, past surgical history and problem list. Problem list updated.  Objective:   Vitals:   12/01/18 0959  BP: 108/70  Pulse: 71  Temp: 98.3 F (36.8 C)  Weight: 168 lb 14.4 oz (76.6 kg)    Fetal Status: Fetal Heart Rate (bpm): 144   Movement: Present     General:  Alert, oriented and cooperative. Patient is in no acute distress.  Skin: Skin is warm and dry. No rash noted.   Cardiovascular: Normal heart rate noted  Respiratory: Normal respiratory effort, no problems with respiration noted  Abdomen: Soft, gravid, appropriate for gestational age. Pain/Pressure: Present     Pelvic:  Cervical exam deferred        Extremities: Normal range of motion.  Edema: None  Mental Status: Normal mood and affect. Normal behavior. Normal judgment and thought content.   Urinalysis:      Assessment and Plan:  Pregnancy: G4P3003 at [redacted]w[redacted]d  1. Supervision of high risk pregnancy, antepartum Stable Glucola today - Tdap vaccine greater than or equal to 7yo IM  2. Cholestatis of pregnancy Stable Continue with Actigall Has MFM appt next week  3. Language Barrier Interrupter used during today's visit  Preterm labor symptoms and  general obstetric precautions including but not limited to vaginal bleeding, contractions, leaking of fluid and fetal movement were reviewed in detail with the patient. Please refer to After Visit Summary for other counseling recommendations.  No follow-ups on file.   Hermina Staggers, MD

## 2018-12-02 LAB — COMPREHENSIVE METABOLIC PANEL
ALT: 28 IU/L (ref 0–32)
AST: 26 IU/L (ref 0–40)
Albumin/Globulin Ratio: 1.3 (ref 1.2–2.2)
Albumin: 3.6 g/dL — ABNORMAL LOW (ref 3.9–5.0)
Alkaline Phosphatase: 154 IU/L — ABNORMAL HIGH (ref 39–117)
BUN/Creatinine Ratio: 9 (ref 9–23)
BUN: 4 mg/dL — ABNORMAL LOW (ref 6–20)
Bilirubin Total: <0.2 mg/dL (ref 0.0–1.2)
CO2: 19 mmol/L — ABNORMAL LOW (ref 20–29)
Calcium: 8.8 mg/dL (ref 8.7–10.2)
Chloride: 101 mmol/L (ref 96–106)
Creatinine, Ser: 0.45 mg/dL — ABNORMAL LOW (ref 0.57–1.00)
GFR calc Af Amer: 158 mL/min/{1.73_m2} (ref >59)
GFR calc non Af Amer: 137 mL/min/{1.73_m2} (ref >59)
Globulin, Total: 2.8 g/dL (ref 1.5–4.5)
Glucose: 136 mg/dL — ABNORMAL HIGH (ref 65–99)
Potassium: 3.5 mmol/L (ref 3.5–5.2)
Sodium: 136 mmol/L (ref 134–144)
Total Protein: 6.4 g/dL (ref 6.0–8.5)

## 2018-12-02 LAB — CBC
Hematocrit: 35 % (ref 34.0–46.6)
Hemoglobin: 11.8 g/dL (ref 11.1–15.9)
MCH: 30.6 pg (ref 26.6–33.0)
MCHC: 33.7 g/dL (ref 31.5–35.7)
MCV: 91 fL (ref 79–97)
Platelets: 311 10*3/uL (ref 150–450)
RBC: 3.86 x10E6/uL (ref 3.77–5.28)
RDW: 12.3 % (ref 11.7–15.4)
WBC: 10.5 10*3/uL (ref 3.4–10.8)

## 2018-12-02 LAB — GLUCOSE TOLERANCE, 2 HOURS W/ 1HR
Glucose, 1 hour: 158 mg/dL (ref 65–179)
Glucose, 2 hour: 128 mg/dL (ref 65–152)
Glucose, Fasting: 78 mg/dL (ref 65–91)

## 2018-12-02 LAB — HIV ANTIBODY (ROUTINE TESTING W REFLEX): HIV Screen 4th Generation wRfx: NONREACTIVE

## 2018-12-02 LAB — RPR: RPR Ser Ql: NONREACTIVE

## 2018-12-04 ENCOUNTER — Other Ambulatory Visit: Payer: Self-pay | Admitting: Obstetrics and Gynecology

## 2018-12-04 DIAGNOSIS — K831 Obstruction of bile duct: Secondary | ICD-10-CM

## 2018-12-04 DIAGNOSIS — O26612 Liver and biliary tract disorders in pregnancy, second trimester: Secondary | ICD-10-CM

## 2018-12-05 ENCOUNTER — Other Ambulatory Visit: Payer: Self-pay

## 2018-12-05 ENCOUNTER — Encounter (HOSPITAL_COMMUNITY): Payer: Self-pay

## 2018-12-05 ENCOUNTER — Ambulatory Visit (HOSPITAL_COMMUNITY)
Admission: RE | Admit: 2018-12-05 | Discharge: 2018-12-05 | Disposition: A | Discharge status: Home / Self Care | Payer: Self-pay | Source: Ambulatory Visit | Attending: Obstetrics and Gynecology | Admitting: Obstetrics and Gynecology

## 2018-12-05 ENCOUNTER — Ambulatory Visit (HOSPITAL_COMMUNITY): Payer: Self-pay | Admitting: *Deleted

## 2018-12-05 ENCOUNTER — Other Ambulatory Visit (HOSPITAL_COMMUNITY): Payer: Self-pay | Admitting: *Deleted

## 2018-12-05 ENCOUNTER — Ambulatory Visit (HOSPITAL_BASED_OUTPATIENT_CLINIC_OR_DEPARTMENT_OTHER): Payer: Self-pay | Admitting: Maternal & Fetal Medicine

## 2018-12-05 DIAGNOSIS — Z363 Encounter for antenatal screening for malformations: Secondary | ICD-10-CM

## 2018-12-05 DIAGNOSIS — K831 Obstruction of bile duct: Secondary | ICD-10-CM

## 2018-12-05 DIAGNOSIS — O26643 Intrahepatic cholestasis of pregnancy, third trimester: Secondary | ICD-10-CM

## 2018-12-05 DIAGNOSIS — Z3A26 26 weeks gestation of pregnancy: Secondary | ICD-10-CM

## 2018-12-05 DIAGNOSIS — O26612 Liver and biliary tract disorders in pregnancy, second trimester: Secondary | ICD-10-CM

## 2018-12-05 DIAGNOSIS — O0990 Supervision of high risk pregnancy, unspecified, unspecified trimester: Secondary | ICD-10-CM

## 2018-12-05 DIAGNOSIS — O26613 Liver and biliary tract disorders in pregnancy, third trimester: Secondary | ICD-10-CM | POA: Insufficient documentation

## 2018-12-05 DIAGNOSIS — O132 Gestational [pregnancy-induced] hypertension without significant proteinuria, second trimester: Secondary | ICD-10-CM

## 2018-12-05 DIAGNOSIS — O09292 Supervision of pregnancy with other poor reproductive or obstetric history, second trimester: Secondary | ICD-10-CM

## 2018-12-05 NOTE — Progress Notes (Signed)
MFM Consultation  Interpreter used   Requesting provider: Emelda Fear, MD Date of service 12/05/18 Reason for request: Cholestasis in pregnancy  Colleen Coleman is a G4P3 who is here at the request of Emelda Fear, MD regarding Cholestasis in pregnancy during the second trimester.  She is overall doing well without pregnancy related complaints. She denies vaginal bleeding loss of fluid or uterine contractions.  Colleen Coleman does note that she experienced pruritis in the mid trimester due to as in her prior pregnancy in the mid to late second trimester.   There were no bile acids or LFT's ordered. However, there was a reference to past history of elevated transaminases. She is currently taking ursidiol 300 mg bid and feeling improve relief in both soled and palms of hands.    Active Ambulatory Problems    Diagnosis Date Noted  . Cholestasis during pregnancy 01/12/2016  . Supervision of high risk pregnancy, antepartum 10/05/2018  . History of gestational diabetes 10/05/2018  . History of gestational hypertension 10/05/2018  . ALT (SGPT) level raised 11/01/2018   Resolved Ambulatory Problems    Diagnosis Date Noted  . Supervision of high risk pregnancy, antepartum 01/12/2016  . Cholestasis of pregnancy in third trimester 02/19/2016   Past Medical History:  Diagnosis Date  . Cholelithiasis complicating pregnancy in third trimester, antepartum   . Depression   . Gestational diabetes   . Hx of chlamydia infection   . Hyperemesis arising during pregnancy   . Obesity (BMI 30.0-34.9)   . Varicose veins    Past Surgical History:  Procedure Laterality Date  . NO PAST SURGERIES     Family History  Problem Relation Age of Onset  . Other Mother        varicosities  . Hypertension Sister   . Other Sister        varicosities  . Depression Sister    OB History  Gravida Para Term Preterm AB Living  4 3 3  0 0 3  SAB TAB Ectopic Multiple Live Births  0 0 0 0  3    # Outcome Date GA Lbr Len/2nd Weight Sex Delivery Anes PTL Lv  4 Current           3 Term 02/20/16 [redacted]w[redacted]d 05:40 / 00:01 5 lb 10.7 oz (2.571 kg) F Vag-Spont None  LIV     Name: DE LEON-ARREAGA,GIRL Parnika     Apgar1: 9  Apgar5: 9  2 Term 12/25/12 [redacted]w[redacted]d 03:33 / 00:10 6 lb 0.8 oz (2.745 kg) F Vag-Spont None  LIV     Complications: Cholestasis of pregnancy     Name: DE LEON-ARREAGA,GIRL Chancy     Apgar1: 8  Apgar5: 9  1 Term 2010 [redacted]w[redacted]d  5 lb 8 oz (2.495 kg) F Vag-Spont   LIV   Current Outpatient Medications on File Prior to Visit  Medication Sig Dispense Refill  . acetaminophen (TYLENOL) 325 MG tablet Take 325 mg by mouth every 6 (six) hours as needed (pain). Reported on 01/15/2016    . aspirin EC 81 MG tablet Take 1 tablet (81 mg total) by mouth daily. Take after 12 weeks for prevention of preeclampsia later in pregnancy 300 tablet 2  . cyclobenzaprine (FLEXERIL) 10 MG tablet Take 1 tablet (10 mg total) by mouth 3 (three) times daily as needed for muscle spasms. 30 tablet 2  . Prenatal Vit-Fe Fumarate-FA (PRENATAL MULTIVITAMIN) TABS tablet Take 1 tablet by mouth daily at 12 noon.    . ursodiol (ACTIGALL)  300 MG capsule Take 1 capsule (300 mg total) by mouth 3 (three) times daily. 90 capsule 3   No current facility-administered medications on file prior to visit.    Family History  Problem Relation Age of Onset  . Other Mother        varicosities  . Hypertension Sister   . Other Sister        varicosities  . Depression Sister      Impression/Counseling:   Cholesatsis in pregnancy is defined by clinical symptoms of puritis caused cholestasis and accumulated bile salts in the dermis. Elevated serum bile salts and transaminase may occur as well.  Typically symptoms present in the third trimester but can present sooner with a prior history, such as in this case.    I discussed with Colleen Coleman the increased risk of poor perinatal outcomes including meconium at delivery,  premature delivery and still birth.  Therefore it is considered reasonable to initiate weekly testing prior to 32 weeks but increase to 2x weekly testing after 32 weeks. Secondly, I discussed that antenatal testing has not consistent with the pathophysiology of fetal outcomes which are unknown.   Therefore we recommend delivery at 37 weeks.  Lastly, consider obtaining bile acids and CMP to better validate the diagnosis and to not overlook other possible diagnosis.  I have scheduled Colleen Coleman to initiate weekly NST/AFI and repeat growth in 3-4 weeks.  I spent 30 minutes in consultation with >50% in face to face consultation with Colleen Coleman.  All questions answered.

## 2018-12-12 ENCOUNTER — Ambulatory Visit (HOSPITAL_COMMUNITY)
Admission: RE | Admit: 2018-12-12 | Discharge: 2018-12-12 | Disposition: A | Discharge status: Home / Self Care | Payer: Self-pay | Source: Ambulatory Visit | Attending: Obstetrics and Gynecology | Admitting: Obstetrics and Gynecology

## 2018-12-12 ENCOUNTER — Encounter (HOSPITAL_COMMUNITY): Payer: Self-pay

## 2018-12-12 ENCOUNTER — Other Ambulatory Visit: Payer: Self-pay

## 2018-12-12 ENCOUNTER — Other Ambulatory Visit (HOSPITAL_COMMUNITY): Payer: Self-pay | Admitting: *Deleted

## 2018-12-12 ENCOUNTER — Ambulatory Visit (HOSPITAL_COMMUNITY): Payer: Self-pay

## 2018-12-12 ENCOUNTER — Ambulatory Visit (HOSPITAL_COMMUNITY): Payer: Self-pay | Admitting: *Deleted

## 2018-12-12 VITALS — BP 108/57 | HR 86 | Temp 98.4°F

## 2018-12-12 DIAGNOSIS — O0990 Supervision of high risk pregnancy, unspecified, unspecified trimester: Secondary | ICD-10-CM

## 2018-12-12 DIAGNOSIS — O26613 Liver and biliary tract disorders in pregnancy, third trimester: Secondary | ICD-10-CM

## 2018-12-12 DIAGNOSIS — O26612 Liver and biliary tract disorders in pregnancy, second trimester: Secondary | ICD-10-CM | POA: Insufficient documentation

## 2018-12-12 DIAGNOSIS — O132 Gestational [pregnancy-induced] hypertension without significant proteinuria, second trimester: Secondary | ICD-10-CM

## 2018-12-12 DIAGNOSIS — Z3A27 27 weeks gestation of pregnancy: Secondary | ICD-10-CM

## 2018-12-12 DIAGNOSIS — O09292 Supervision of pregnancy with other poor reproductive or obstetric history, second trimester: Secondary | ICD-10-CM

## 2018-12-12 DIAGNOSIS — K831 Obstruction of bile duct: Secondary | ICD-10-CM | POA: Insufficient documentation

## 2018-12-13 ENCOUNTER — Telehealth: Payer: Self-pay

## 2018-12-13 NOTE — Telephone Encounter (Signed)
Called the patient with the in office interrupter Etta to confirm the video visit. Left a detailed voicemail of how to download the [employer] webex app and please call our office if she has any questions or concerns.

## 2018-12-15 ENCOUNTER — Ambulatory Visit (INDEPENDENT_AMBULATORY_CARE_PROVIDER_SITE_OTHER): Payer: Self-pay | Admitting: Medical

## 2018-12-15 ENCOUNTER — Telehealth: Payer: Self-pay | Admitting: Medical

## 2018-12-15 ENCOUNTER — Encounter: Payer: Self-pay | Admitting: Obstetrics & Gynecology

## 2018-12-15 ENCOUNTER — Other Ambulatory Visit: Payer: Self-pay

## 2018-12-15 VITALS — BP 102/60 | HR 80 | Wt 171.4 lb

## 2018-12-15 DIAGNOSIS — O0990 Supervision of high risk pregnancy, unspecified, unspecified trimester: Secondary | ICD-10-CM

## 2018-12-15 DIAGNOSIS — O26613 Liver and biliary tract disorders in pregnancy, third trimester: Secondary | ICD-10-CM

## 2018-12-15 DIAGNOSIS — Z3A28 28 weeks gestation of pregnancy: Secondary | ICD-10-CM

## 2018-12-15 DIAGNOSIS — O0993 Supervision of high risk pregnancy, unspecified, third trimester: Secondary | ICD-10-CM

## 2018-12-15 DIAGNOSIS — Z8759 Personal history of other complications of pregnancy, childbirth and the puerperium: Secondary | ICD-10-CM

## 2018-12-15 DIAGNOSIS — Z8632 Personal history of gestational diabetes: Secondary | ICD-10-CM

## 2018-12-15 DIAGNOSIS — O26619 Liver and biliary tract disorders in pregnancy, unspecified trimester: Secondary | ICD-10-CM

## 2018-12-15 DIAGNOSIS — K831 Obstruction of bile duct: Secondary | ICD-10-CM

## 2018-12-15 NOTE — Patient Instructions (Signed)
Contracciones de Braxton Hicks Braxton Hicks Contractions Las contracciones del tero pueden presentarse durante todo el embarazo, pero no siempre indican que la mujer est de parto. Es posible que usted haya tenido contracciones de prctica llamadas "contracciones de Braxton Hicks". A veces, se las confunde con el parto real. Qu son las contracciones de Braxton Hicks? Las contracciones de Braxton Hicks son espasmos que se producen en los msculos del tero antes del parto. A diferencia de las contracciones del parto verdadero, estas no producen el agrandamiento (la dilatacin) ni el afinamiento del cuello uterino. Hacia el final del embarazo (entre las semanas 32y34), las contracciones de Braxton Hicks pueden presentarse ms seguido y tornarse ms intensas. A veces, resulta difcil distinguirlas del parto verdadero porque pueden ser muy molestas. No debe sentirse avergonzada si concurre al hospital con falso parto. En ocasiones, la nica forma de saber si el trabajo de parto es verdadero es que el mdico determine si hay cambios en el cuello del tero. El mdico le har un examen fsico y quizs le controle las contracciones. Si usted no est de parto verdadero, el examen debe indicar que el cuello uterino no est dilatado y que usted no ha roto bolsa. Si no hay otros problemas de salud asociados con su embarazo, no habr inconvenientes si la envan a su casa con un falso parto. Es posible que las contracciones de Braxton Hicks continen hasta que se desencadene el parto verdadero. Cmo diferenciar el trabajo de parto falso del verdadero Trabajo de parto verdadero  Las contracciones duran de 30a70segundos.  Las contracciones pueden tornarse muy regulares.  La molestia generalmente se siente en la parte superior del tero y se extiende hacia la zona baja del abdomen y hacia la cintura.  Las contracciones no desaparecen cuando usted camina.  Las contracciones generalmente se hacen ms  intensas y aumentan en frecuencia.  El cuello uterino se dilata y se afina. Parto falso  En general, las contracciones son ms cortas y no tan intensas como las del parto verdadero.  En general, las contracciones son irregulares.  A menudo, las contracciones se sienten en la parte delantera de la parte baja del abdomen y en la ingle.  Las contracciones pueden desaparecer cuando usted camina o cambia de posicin mientras est acostada.  Las contracciones se vuelven ms dbiles y su duracin es menor a medida que transcurre el tiempo.  En general, el cuello uterino no se dilata ni se afina. Siga estas indicaciones en su casa:   Tome los medicamentos de venta libre y los recetados solamente como se lo haya indicado el mdico.  Contine haciendo los ejercicios habituales y siga las dems indicaciones que el mdico le d.  Coma y beba con moderacin si cree que est de parto.  Si las contracciones de Braxton Hicks le provocan incomodidad: ? Cambie de posicin: si est acostada o descansando, camine; si est caminando, descanse. ? Sintese y descanse en una baera con agua tibia. ? Beba suficiente lquido como para mantener la orina de color amarillo plido. La deshidratacin puede provocar contracciones. ? Respire lenta y profundamente varias veces por hora.  Vaya a todas las visitas de control prenatales y de control como se lo haya indicado el mdico. Esto es importante. Comunquese con un mdico si:  Tiene fiebre.  Siente dolor constante en el abdomen. Solicite ayuda de inmediato si:  Las contracciones se intensifican, se hacen ms regulares y cercanas entre s.  Tiene una prdida de lquido por la vagina.  Elimina   una mucosidad sanguinolenta (prdida del tapn mucoso).  Tiene una hemorragia vaginal.  Tiene un dolor en la zona lumbar que nunca tuvo antes.  Siente que la cabeza del beb empuja hacia abajo y ejerce presin en la zona plvica.  El beb no se mueve tanto  como antes. Resumen  Las [employer] se presentan antes del parto se conocen como contracciones de [place] parto o contracciones de Location manager.  En general, las contracciones de Braxton Hicks son ms cortas, ms dbiles, con ms tiempo entre una y [place], y menos regulares que las contracciones del parto verdadero. Las contracciones del parto verdadero se intensifican progresivamente y se tornan regulares y ms frecuentes.  Para controlar la [employer] producen las contracciones de [place], puede cambiar de posicin, darse un bao templado y Production assistant, radio, beber mucha agua o practicar la respiracin profunda. Esta informacin no tiene Marine scientist el consejo del mdico. Asegrese de hacerle al mdico cualquier pregunta que tenga. Document Released: 01/31/2017 Document Revised: 06/13/2017 Document Reviewed: 01/31/2017 Elsevier Interactive Patient Education  2019 [employer]. Evaluacin de los movimientos fetales Fetal Movement Counts Introduccin Nombre del paciente: ________________________________________________ Karalee Height estimada: ____________________ Allean Found evaluacin de los movimientos fetales?  Una evaluacin de los movimientos fetales es el registro del nmero de veces que siente que el beb se mueve durante un cierto perodo de [place]. Esto tambin se puede denominar recuento de patadas fetales. Una evaluacin de movimientos fetales se recomienda a todas las embarazadas. Es posible que le indiquen que comience a Chief of Staff los movimientos fetales desde la semana 28 de [place]. Preste atencin cuando sienta que el beb est ms activo. Podr detectar los ciclos en que el beb duerme y est despierto. Tambin podr detectar que ciertas cosas hacen que su beb se mueva ms. Deber realizar una evaluacin de los movimientos fetales en las siguientes situaciones:  Cuando el beb est ms activo habitualmente.  A la [employer], todos los [place]. Un buen  momento para evaluar los movimientos fetales es cuando est descansando, despus de haber comido y bebido algo. Cmo debo contar los movimientos fetales? 1. Encuentre un lugar tranquilo y cmodo. Sintese o acustese de lado. 2. Anote la fecha, la hora de inicio y de finalizacin y la cantidad de movimientos que sinti entre esas dos horas. Lleve esta informacin a las visitas de control. 3. Cuente las pataditas, revoloteos, chasquidos, vueltas o pinchazos en un perodo de 2horas. Debe sentir al menos 102movimientos en 2horas. 4. Cuando sienta 36movimientos, puede dejar de contar. 5. Si no siente 77movimientos en 2horas, coma y beba algo. Luego, contine descansando y Financial risk analyst. Si siente al menos 53movimientos durante esa hora, puede dejar de contar. Comunquese con un mdico si:  Siente menos de 38movimientos en 2horas.  El beb no se mueve tanto como suele hacerlo. Fecha: ____________ Eda Paschal inicio: ____________ Eda Paschal finalizacin: ____________ Movimientos: ____________ Toma Copier: ____________ Eda Paschal inicio: ____________ Eda Paschal finalizacin: ____________ Movimientos: ____________ Toma Copier: ____________ Eda Paschal inicio: ____________ Eda Paschal finalizacin: ____________ Movimientos: ____________ Toma Copier: ____________ Eda Paschal inicio: ____________ Eda Paschal finalizacin: ____________ Movimientos: ____________ Toma Copier: ____________ Eda Paschal inicio: ____________ Mellody Drown de finalizacin: ____________ Movimientos: ____________ Toma Copier: ____________ Eda Paschal inicio: ____________ Mellody Drown de finalizacin: ____________ Movimientos: ____________ Toma Copier: ____________ Eda Paschal inicio: ____________ Eda Paschal finalizacin: ____________ Movimientos: ____________ Toma Copier: ____________ Eda Paschal inicio: ____________ Eda Paschal finalizacin: ____________ Movimientos: ____________ Toma Copier: ____________ Eda Paschal inicio: ____________ Mellody Drown de finalizacin: ____________ Movimientos: ____________ Esta informacin no tiene  Esta informacin no tiene como  fin reemplazar el consejo del mdico. Asegrese de hacerle al mdico cualquier pregunta que tenga. Document Released: 09/28/2007 Document Revised: 09/24/2016 Document Reviewed: 07/31/2015 Elsevier Interactive Patient Education  2019 Elsevier Inc.  

## 2018-12-15 NOTE — Progress Notes (Signed)
I connected with@ on 12/15/18 at  8:55 AM EDT by: WebEx and verified that I am speaking with the correct person using two identifiers.  Patient is located at home and provider is located at home.     The purpose of this virtual visit is to provide medical care while limiting exposure to the novel coronavirus. I discussed the limitations, risks, security and privacy concerns of performing an evaluation and management service by Webex and the availability of in person appointments. I also discussed with the patient that there may be a patient responsible charge related to this service. By engaging in this virtual visit, you consent to the provision of healthcare.  Additionally, you authorize for your insurance to be billed for the services provided during this visit.  The patient expressed understanding and agreed to proceed.  The following staff members participated in the virtual visit:  Geralyn Flash, RN    PRENATAL VISIT NOTE  Subjective:  Colleen Coleman is a 28 y.o. G4P3003 at [redacted]w[redacted]d  for phone visit for ongoing prenatal care.  She is currently monitored for the following issues for this low-risk pregnancy and has Cholestasis during pregnancy; Supervision of high risk pregnancy, antepartum; History of gestational diabetes; History of gestational hypertension; and ALT (SGPT) level raised on their problem list.  Patient reports occasional contractions.  Contractions: Irregular. Vag. Bleeding: None.  Movement: Present. Denies leaking of fluid.   The following portions of the patient's history were reviewed and updated as appropriate: allergies, current medications, past family history, past medical history, past social history, past surgical history and problem list.   Objective:   Vitals:   12/15/18 0907  BP: 102/60  Pulse: 80  Weight: 171 lb 6.4 oz (77.7 kg)   Self-Obtained  Fetal Status:     Movement: Present     Assessment and Plan:  Pregnancy: G4P3003 at [redacted]w[redacted]d 1. Supervision  of high risk pregnancy, antepartum - Doing well, only few BH contractions, no more than 3/day  2. History of gestational diabetes - Normal GTT this pregnancy  3. History of gestational hypertension - Normotensive today   4. Cholestasis during pregnancy, antepartum - Plan for IOL at 37 weeks   Preterm labor symptoms and general obstetric precautions including but not limited to vaginal bleeding, contractions, leaking of fluid and fetal movement were reviewed in detail with the patient.  Return in about 2 weeks (around 12/29/2018) for [facility], Virtual.  Future Appointments  Date Time Provider Cook  12/25/2018  7:45 AM [place] MFC-US  12/25/2018  7:45 AM WH-MFC [place] 2 WH-MFCUS MFC-US  12/29/2018 10:35 AM Anyanwu, Sallyanne Havers, MD WOC-WOCA WOC  01/02/2019  8:30 AM WH-MFC NURSE [place] MFC-US  01/02/2019  8:30 AM WH-MFC [place] 1 WH-MFCUS MFC-US  01/09/2019  8:30 AM WH-MFC NURSE [place] MFC-US  01/09/2019  8:30 AM WH-MFC [place] 1 WH-MFCUS MFC-US     Time spent on virtual visit: 10 minutes  Kerry Hough, PA-C

## 2018-12-15 NOTE — Progress Notes (Signed)
I connected with  Colleen Coleman on 12/15/18 at  8:55 AM EDT by telephone and verified that I am speaking with the correct person using two identifiers.   I discussed the limitations, risks, security and privacy concerns of performing an evaluation and management service by telephone and virtually and the availability of in person appointments. I also discussed with the patient that there may be a patient responsible charge related to this service. The patient expressed understanding and agreed to proceed. C/o pressure that varies. C/o uc's about 3 per day.  Linda,RN 12/15/2018  9:01 AM

## 2018-12-15 NOTE — Telephone Encounter (Signed)
Attempted to call patient w/ Raquel Spanish interpreter to get patient scheduled for her next virtual visit on 6/24 @ 10:35. No answer, voicemail was left with appointment information as well as patient being instructed that it will be a virtual visit. App is already downloaded from previous virtual visit.

## 2018-12-25 ENCOUNTER — Other Ambulatory Visit: Payer: Self-pay

## 2018-12-25 ENCOUNTER — Ambulatory Visit (HOSPITAL_COMMUNITY)
Admission: RE | Admit: 2018-12-25 | Discharge: 2018-12-25 | Disposition: A | Discharge status: Home / Self Care | Payer: Self-pay | Source: Ambulatory Visit | Attending: Obstetrics and Gynecology | Admitting: Obstetrics and Gynecology

## 2018-12-25 ENCOUNTER — Ambulatory Visit (HOSPITAL_COMMUNITY): Payer: Self-pay | Admitting: *Deleted

## 2018-12-25 ENCOUNTER — Encounter (HOSPITAL_COMMUNITY): Payer: Self-pay

## 2018-12-25 VITALS — BP 106/65 | HR 88 | Temp 97.9°F

## 2018-12-25 DIAGNOSIS — K831 Obstruction of bile duct: Secondary | ICD-10-CM

## 2018-12-25 DIAGNOSIS — O26613 Liver and biliary tract disorders in pregnancy, third trimester: Secondary | ICD-10-CM

## 2018-12-25 DIAGNOSIS — O0990 Supervision of high risk pregnancy, unspecified, unspecified trimester: Secondary | ICD-10-CM | POA: Insufficient documentation

## 2018-12-29 ENCOUNTER — Ambulatory Visit (INDEPENDENT_AMBULATORY_CARE_PROVIDER_SITE_OTHER): Payer: Self-pay | Admitting: Obstetrics & Gynecology

## 2018-12-29 ENCOUNTER — Other Ambulatory Visit: Payer: Self-pay

## 2018-12-29 VITALS — BP 104/70 | HR 92 | Wt 171.6 lb

## 2018-12-29 DIAGNOSIS — Z8759 Personal history of other complications of pregnancy, childbirth and the puerperium: Secondary | ICD-10-CM

## 2018-12-29 DIAGNOSIS — K831 Obstruction of bile duct: Secondary | ICD-10-CM

## 2018-12-29 DIAGNOSIS — O26613 Liver and biliary tract disorders in pregnancy, third trimester: Secondary | ICD-10-CM

## 2018-12-29 DIAGNOSIS — Z3A30 30 weeks gestation of pregnancy: Secondary | ICD-10-CM

## 2018-12-29 DIAGNOSIS — O0993 Supervision of high risk pregnancy, unspecified, third trimester: Secondary | ICD-10-CM

## 2018-12-29 DIAGNOSIS — O0990 Supervision of high risk pregnancy, unspecified, unspecified trimester: Secondary | ICD-10-CM

## 2018-12-29 NOTE — Progress Notes (Signed)
TELEHEALTH OBSTETRICS PRENATAL VIRTUAL VIDEO VISIT ENCOUNTER NOTE  [facility]   I connected with Colleen Coleman on 12/29/18 at 10:35 AM EDT by WebEx Video Encounter at home and verified that I am speaking with the correct person using two identifiers. Patient is Spanish-speaking only, Spanish interpreter present for this encounter.   I discussed the limitations, risks, security and privacy concerns of performing an evaluation and management service by telephone and the availability of in person appointments. I also discussed with the patient that there may be a patient responsible charge related to this service. The patient expressed understanding and agreed to proceed. Subjective:  Colleen Coleman is a 28 y.o. [phone] [id number] at 1027w0d being seen today for ongoing prenatal care.  She is currently monitored for the following issues for this low-risk pregnancy and has Cholestasis during pregnancy; Supervision of high risk pregnancy, antepartum; History of gestational diabetes; and History of gestational hypertension on their problem list.  Patient reports no complaints.  Contractions: Irritability. Vag. Bleeding: None.  Movement: Present. Denies any leaking of fluid.   The following portions of the patient's history were reviewed and updated as appropriate: allergies, current medications, past family history, past medical history, past social history, past surgical history and problem list.   Objective:   Vitals:   12/29/18 1108  BP: 104/70  Pulse: 92  Weight: 171 lb 9.6 oz (77.8 kg)    Fetal Status:     Movement: Present     General:  Alert, oriented and cooperative. Patient is in no acute distress.  Respiratory: Normal respiratory effort, no problems with respiration noted  Mental Status: Normal mood and affect. Normal behavior. Normal judgment and thought content.  Rest of physical exam deferred due to type of  encounter  Imaging: [place] Mfm Fetal Bpp Wo Non Stress  Result Date: 12/25/2018 ----------------------------------------------------------------------  OBSTETRICS REPORT                       (Signed Final 12/25/2018 12:28 pm) ---------------------------------------------------------------------- Patient Info  ID #:       [id number]                          D.O.B.:  [birth year] (27 yrs)(F)  Name:       Colleen BoozerYURBY DE LEON-                  Visit Date: 12/25/2018 08:54 am              [facility] ---------------------------------------------------------------------- Performed By  Performed By:     Marcellina MillinKelly L Moser          Ref. Address:     [address]                    [place]                                                             [address]  Attending:        Lin Landsmanorenthian Booker      [facility]                                       [facility]  Referred By:      [facility] ---------------------------------------------------------------------- Orders   #  Description                          Code         Ordered By   1  [place] MFM FETAL BPP WO NON              76819.01     [facility]      STRESS  ----------------------------------------------------------------------   #  Order #                    Accession #                 Episode #   1  [id number]                  [id number]                  [id number]  ---------------------------------------------------------------------- Indications   Cholestasis of pregnancy, second trimester     O26.612K83.1   [redacted] weeks gestation of pregnancy                Z3A.29   Gestational hypertension without significant   O13.2   proteinuria, second trimester   Poor obstetric history: Previous gestational   O09.299   diabetes  ---------------------------------------------------------------------- Fetal Evaluation  Num Of Fetuses:         1  Fetal Heart Rate(bpm):  131  Cardiac Activity:       Observed  Presentation:           Homero Fellers breech  Placenta:                Posterior  Amniotic Fluid  AFI FV:      Within normal limits  AFI Sum(cm)     %Tile       Largest Pocket(cm)  15.22           54          5.18  RUQ(cm)       RLQ(cm)       LUQ(cm)        LLQ(cm)  4.23          2.61          5.18           3.2 ---------------------------------------------------------------------- Biophysical Evaluation  Amniotic F.V:   Within normal limits       F. Tone:        Observed  F. Movement:    Observed                   Score:          8/8  F. Breathing:   Observed ---------------------------------------------------------------------- OB History  Gravidity:    4         Term:   3        Prem:   0        SAB:   0  TOP:          0       Ectopic:  0        Living: 3 ----------------------------------------------------------------------  Gestational Age  LMP:           30w 3d        Date:  05/26/18                 EDD:   03/02/19  Best:          29w 3d     Det. By:  Previous Ultrasound      EDD:   03/09/19                                      (10/09/18) ---------------------------------------------------------------------- Anatomy  Stomach:               Appears normal, left   Bladder:                Appears normal                         sided ---------------------------------------------------------------------- Impression  Biophysical profile 8/8  Cholestasis in pregnancy. ---------------------------------------------------------------------- Recommendations  Continue weekly testing  Repeat growth in 2 weeks.  Delivery by 37 weeks given diagnosis of ICP> ----------------------------------------------------------------------               Lin Landsmanorenthian Booker, MD Electronically Signed Final Report   12/25/2018 12:28 pm ----------------------------------------------------------------------  [place] Mfm Ob Detail +14 Wk  Result Date: 12/05/2018 ----------------------------------------------------------------------  OBSTETRICS REPORT                       (Signed Final 12/05/2018 12:03 pm)  ---------------------------------------------------------------------- Patient Info  ID #:       [id number]                          D.O.B.:  October 03, 1990 (27 yrs)(F)  Name:       Colleen BoozerYURBY DE LEON-                  Visit Date: 12/05/2018 08:26 am              ARREAGA ---------------------------------------------------------------------- Performed By  Performed By:     Eden Lathearrie Stalter BS      Ref. Address:     [address]                    [place]                                                             [address]  Attending:        Lin Landsmanorenthian Booker      [facility]                                       [facility]  [facility] ---------------------------------------------------------------------- Orders   #  Description  Code         Ordered By   1  [place] MFM OB DETAIL +14 WK              [id number]     Warren Bing  ----------------------------------------------------------------------   #  Order #                    Accession #                 Episode #   1  [id number]                  [id number]                  [id number]  ---------------------------------------------------------------------- Indications   Antenatal screening for malformations          Z36.3   Cholestasis of pregnancy, second trimester     O26.612K83.1   Gestational hypertension without significant   O13.2   proteinuria, second trimester   Poor obstetric history: Previous gestational   O09.299   diabetes   [redacted] weeks gestation of pregnancy                Z3A.26  ---------------------------------------------------------------------- Fetal Evaluation  Num Of Fetuses:         1  Fetal Heart Rate(bpm):  142  Cardiac Activity:       Observed  Presentation:           Variable  Placenta:               Posterior  P. Cord Insertion:      Visualized  Amniotic Fluid  AFI FV:      Within normal limits                              Largest Pocket(cm)                              6.1  ---------------------------------------------------------------------- Biometry  BPD:      63.6  mm     G. Age:  25w 5d         15  %    CI:         68.6   %    70 - 86                                                          FL/HC:      19.0   %    18.6 - 20.4  HC:      245.4  mm     G. Age:  26w 4d         26  %    HC/AC:      1.09        1.05 - 1.21  AC:      225.9  mm     G. Age:  27w 0d         54  %    FL/BPD:     73.3   %    71 - 87  FL:       46.6  mm     G.  Age:  25w 4d         11  %    FL/AC:      20.6   %    20 - 24  HUM:      42.4  mm     G. Age:  25w 3d         19  %  CM:        6.6  mm  Est. FW:     926  gm      2 lb 1 oz     49  % ---------------------------------------------------------------------- OB History  Gravidity:    4         Term:   3        Prem:   0        SAB:   0  TOP:          0       Ectopic:  0        Living: 3 ---------------------------------------------------------------------- Gestational Age  LMP:           27w 4d        Date:  05/26/18                 EDD:   03/02/19  U/S Today:     26w 2d                                        EDD:   03/11/19  Best:          26w 4d     Det. By:  Previous Ultrasound      EDD:   03/09/19                                      (10/09/18) ---------------------------------------------------------------------- Anatomy  Cranium:               Appears normal         Aortic Arch:            Appears normal  Cavum:                 Appears normal         Ductal Arch:            Appears normal  Ventricles:            Appears normal         Diaphragm:              Appears normal  Choroid Plexus:        Appears normal         Stomach:                Appears normal, left                                                                        sided  Cerebellum:            Appears normal  Abdomen:                Appears normal  Posterior Fossa:       Appears normal         Abdominal Wall:         Not well visualized  Nuchal Fold:           Not applicable  (>78    Cord Vessels:           Appears normal ([redacted]                         wks GA)                                        vessel cord)  Face:                  Not well visualized    Kidneys:                Appear normal  Lips:                  Appears normal         Bladder:                Appears normal  Thoracic:              Appears normal         Spine:                  Appears normal  Heart:                 Appears normal         Upper Extremities:      Appears normal                         (4CH, axis, and                         situs)  RVOT:                  Appears normal         Lower Extremities:      Appears normal  LVOT:                  Appears normal  Other:  Heels visualized. Nasal bone visualized. Right 5th digit visualized. ---------------------------------------------------------------------- Cervix Uterus Adnexa  Cervix  Length:            3.8  cm.  Normal appearance by transabdominal scan.  Uterus  No abnormality visualized.  Left Ovary  Not visualized.  Right Ovary  Not visualized.  Cul De Sac  No free fluid seen.  Adnexa  No abnormality visualized. ---------------------------------------------------------------------- Impression  Unsure LMP- dated by 68 w 3 day ultrasound performed on  03/09/19 at [facility] (this was adjusted today  given in EPIC by our nurse).  Cholestasis of pregnancy in current and past pregnancy ---------------------------------------------------------------------- Recommendations  Repeat growth in 4 weeks.  Initiate weekly NST/AFI  Plan for delivery by 37 weeks  Consult performed today-see EPIC for details. ----------------------------------------------------------------------               Sander Nephew, MD Electronically Signed Final Report   12/05/2018 12:03 pm ----------------------------------------------------------------------  [place] Mfm Ob Limited  Result Date: 12/12/2018 ----------------------------------------------------------------------   OBSTETRICS REPORT                       (Signed Final 12/12/2018 12:34 pm) ---------------------------------------------------------------------- Patient Info  ID #:       [id number]                          D.O.B.:  1990-11-15 (27 yrs)(F)  Name:       Colleen Coleman-                  Visit Date: 12/12/2018 09:02 am              AR[facility] ---------------------------------------------------------------------- Performed By  Performed By:     Percell Boston          Ref. Address:     [address]                    [place]                                                             [address]  Attending:        Noralee Space MD        [facility]  Referred By:      [facility] ---------------------------------------------------------------------- Orders   #  Description                          Code         Ordered By   1  [place] MFM OB LIMITED                    40981.19     Lin Landsman  ----------------------------------------------------------------------   #  Order #                    Accession #                 Episode #   1  [id number]                  [id number]                  [id number]  ---------------------------------------------------------------------- Indications   [redacted] weeks gestation of pregnancy                Z3A.27   Cholestasis of pregnancy, second trimester  Z61.096E45.4   Gestational hypertension without significant   O13.2   proteinuria, second trimester   Poor obstetric history: Previous gestational   O09.299   diabetes  ---------------------------------------------------------------------- Fetal Evaluation  Num Of Fetuses:         1  Fetal Heart Rate(bpm):  135  Cardiac Activity:       Observed  Presentation:           Breech  Placenta:               Posterior  P. Cord Insertion:      Visualized, central  Amniotic Fluid  AFI FV:      Within normal limits                               Largest Pocket(cm)                              4.61 ---------------------------------------------------------------------- OB History  Gravidity:    4         Term:   3        Prem:   0        SAB:   0  TOP:          0       Ectopic:  0        Living: 3 ---------------------------------------------------------------------- Gestational Age  LMP:           28w 4d        Date:  05/26/18                 EDD:   03/02/19  Best:          27w 4d     Det. By:  Previous Ultrasound      EDD:   03/09/19                                      (10/09/18) ---------------------------------------------------------------------- Anatomy  Thoracic:              Appears normal         Kidneys:                Appear normal  Stomach:               Appears normal, left   Bladder:                Appears normal                         sided ---------------------------------------------------------------------- Cervix Uterus Adnexa  Cervix  Length:            4.8  cm.  Normal appearance by transabdominal scan.  Uterus  No abnormality visualized.  Left Ovary  No adnexal mass visualized.  Right Ovary  No adnexal mass visualized.  Cul De Sac  No free fluid seen.  Adnexa  No abnormality visualized. ---------------------------------------------------------------------- Impression  Intrahepatic cholestasis of pregnancy. Patient takes actigall  and has improvement in symptoms (itching). She is here for  NST.  A limited ultrasound study was performed. Amniotic fluid is  normal and good fetal activity is seen. NST was not  performed because of early gestational age. I reassured the  patient (with help of  our interpreter) and discussed antenatal  testing. ---------------------------------------------------------------------- Recommendations  Weekly BPP to begin in 2 weeks.Appointments were made. ----------------------------------------------------------------------                  Noralee Space, MD Electronically Signed Final  Report   12/12/2018 12:34 pm ----------------------------------------------------------------------   Assessment and Plan:  Pregnancy: [id number] at [redacted]w[redacted]d 1. Cholestasis during pregnancy in third trimester Continue Ursodiol, continue weekly BPP. Delivery at 37 weeks.  2. History of gestational hypertension Stable BP for now.  3. Supervision of high risk pregnancy, antepartum Preterm labor symptoms and general obstetric precautions including but not limited to vaginal bleeding, contractions, leaking of fluid and fetal movement were reviewed in detail with the patient. I discussed the assessment and treatment plan with the patient. The patient was provided an opportunity to ask questions and all were answered. The patient agreed with the plan and demonstrated an understanding of the instructions. The patient was advised to call back or seek an in-person office evaluation/go to [facility] at [facility] for any urgent or concerning symptoms. Please refer to After Visit Summary for other counseling recommendations.   I provided 10 minutes of face-to-face time during this encounter.  Return for Virtual [facility] Visit (Spanish).  Future Appointments  Date Time Provider Department Center  01/02/2019  8:30 AM WH-MFC NURSE WH-MFC MFC-US  01/02/2019  8:30 AM WH-MFC [place] 1 WH-MFCUS MFC-US  01/09/2019  8:30 AM WH-MFC NURSE WH-MFC MFC-US  01/09/2019  8:30 AM WH-MFC [place] 1 WH-MFCUS MFC-US    Jaynie Collins, MD [facility] [employer], [facility]

## 2018-12-29 NOTE — Patient Instructions (Signed)
Regrese a la clinica cuando tenga su cita. Si tiene problemas o preguntas, llama a la clinica o vaya a la sala de emerg[facility] al [facility].    

## 2019-01-02 ENCOUNTER — Ambulatory Visit (HOSPITAL_COMMUNITY)
Admission: RE | Admit: 2019-01-02 | Discharge: 2019-01-02 | Disposition: A | Discharge status: Home / Self Care | Payer: Self-pay | Source: Ambulatory Visit | Attending: Obstetrics and Gynecology | Admitting: Obstetrics and Gynecology

## 2019-01-02 ENCOUNTER — Encounter (HOSPITAL_COMMUNITY): Payer: Self-pay

## 2019-01-02 ENCOUNTER — Other Ambulatory Visit: Payer: Self-pay

## 2019-01-02 ENCOUNTER — Ambulatory Visit (HOSPITAL_COMMUNITY): Payer: Self-pay | Admitting: *Deleted

## 2019-01-02 ENCOUNTER — Other Ambulatory Visit (HOSPITAL_COMMUNITY): Payer: Self-pay | Admitting: *Deleted

## 2019-01-02 VITALS — BP 108/63 | HR 94 | Temp 98.1°F

## 2019-01-02 DIAGNOSIS — K831 Obstruction of bile duct: Secondary | ICD-10-CM

## 2019-01-02 DIAGNOSIS — O09293 Supervision of pregnancy with other poor reproductive or obstetric history, third trimester: Secondary | ICD-10-CM

## 2019-01-02 DIAGNOSIS — O133 Gestational [pregnancy-induced] hypertension without significant proteinuria, third trimester: Secondary | ICD-10-CM

## 2019-01-02 DIAGNOSIS — Z3A30 30 weeks gestation of pregnancy: Secondary | ICD-10-CM

## 2019-01-02 DIAGNOSIS — O26613 Liver and biliary tract disorders in pregnancy, third trimester: Secondary | ICD-10-CM

## 2019-01-02 DIAGNOSIS — O26643 Intrahepatic cholestasis of pregnancy, third trimester: Secondary | ICD-10-CM

## 2019-01-09 ENCOUNTER — Ambulatory Visit (HOSPITAL_COMMUNITY): Payer: Self-pay | Admitting: *Deleted

## 2019-01-09 ENCOUNTER — Ambulatory Visit (HOSPITAL_COMMUNITY)
Admission: RE | Admit: 2019-01-09 | Discharge: 2019-01-09 | Disposition: A | Discharge status: Home / Self Care | Payer: Self-pay | Source: Ambulatory Visit | Attending: Obstetrics and Gynecology | Admitting: Obstetrics and Gynecology

## 2019-01-09 ENCOUNTER — Other Ambulatory Visit: Payer: Self-pay

## 2019-01-09 ENCOUNTER — Other Ambulatory Visit (HOSPITAL_COMMUNITY): Payer: Self-pay | Admitting: *Deleted

## 2019-01-09 ENCOUNTER — Other Ambulatory Visit (HOSPITAL_COMMUNITY): Payer: Self-pay | Admitting: Obstetrics and Gynecology

## 2019-01-09 ENCOUNTER — Encounter (HOSPITAL_COMMUNITY): Payer: Self-pay | Admitting: *Deleted

## 2019-01-09 DIAGNOSIS — O0990 Supervision of high risk pregnancy, unspecified, unspecified trimester: Secondary | ICD-10-CM | POA: Insufficient documentation

## 2019-01-09 DIAGNOSIS — O26613 Liver and biliary tract disorders in pregnancy, third trimester: Secondary | ICD-10-CM | POA: Insufficient documentation

## 2019-01-09 DIAGNOSIS — Z362 Encounter for other antenatal screening follow-up: Secondary | ICD-10-CM

## 2019-01-09 DIAGNOSIS — O26643 Intrahepatic cholestasis of pregnancy, third trimester: Secondary | ICD-10-CM

## 2019-01-09 DIAGNOSIS — K831 Obstruction of bile duct: Secondary | ICD-10-CM

## 2019-01-09 DIAGNOSIS — O133 Gestational [pregnancy-induced] hypertension without significant proteinuria, third trimester: Secondary | ICD-10-CM

## 2019-01-09 DIAGNOSIS — Z3A31 31 weeks gestation of pregnancy: Secondary | ICD-10-CM

## 2019-01-09 DIAGNOSIS — O09293 Supervision of pregnancy with other poor reproductive or obstetric history, third trimester: Secondary | ICD-10-CM

## 2019-01-12 ENCOUNTER — Telehealth: Payer: Self-pay | Admitting: Obstetrics and Gynecology

## 2019-01-12 ENCOUNTER — Encounter: Payer: Self-pay | Admitting: Family Medicine

## 2019-01-12 NOTE — Telephone Encounter (Signed)
Called the patient to confirm the virutal visit scheduled for tomorrow. Informed the patient of being signed into the virtual visit 15 minutes prior to time. Informed the patient of how to download the [employer] webex app and ensured the patient could log in and access the appointment. Patient verbalized understanding.   In office interpreter Raquel

## 2019-01-15 ENCOUNTER — Ambulatory Visit (HOSPITAL_COMMUNITY): Payer: Self-pay | Admitting: *Deleted

## 2019-01-15 ENCOUNTER — Ambulatory Visit (HOSPITAL_COMMUNITY): Payer: Self-pay

## 2019-01-15 ENCOUNTER — Ambulatory Visit (INDEPENDENT_AMBULATORY_CARE_PROVIDER_SITE_OTHER): Payer: Self-pay | Admitting: Obstetrics & Gynecology

## 2019-01-15 ENCOUNTER — Other Ambulatory Visit: Payer: Self-pay

## 2019-01-15 ENCOUNTER — Other Ambulatory Visit (HOSPITAL_COMMUNITY): Payer: Self-pay | Admitting: Obstetrics and Gynecology

## 2019-01-15 ENCOUNTER — Encounter (HOSPITAL_COMMUNITY): Payer: Self-pay

## 2019-01-15 ENCOUNTER — Ambulatory Visit (HOSPITAL_COMMUNITY)
Admission: RE | Admit: 2019-01-15 | Discharge: 2019-01-15 | Disposition: A | Discharge status: Home / Self Care | Payer: Self-pay | Source: Ambulatory Visit | Attending: Obstetrics and Gynecology | Admitting: Obstetrics and Gynecology

## 2019-01-15 VITALS — BP 102/63 | HR 82 | Temp 98.2°F

## 2019-01-15 VITALS — BP 96/74 | HR 91 | Wt 172.0 lb

## 2019-01-15 DIAGNOSIS — O26613 Liver and biliary tract disorders in pregnancy, third trimester: Secondary | ICD-10-CM | POA: Insufficient documentation

## 2019-01-15 DIAGNOSIS — K831 Obstruction of bile duct: Secondary | ICD-10-CM

## 2019-01-15 DIAGNOSIS — O133 Gestational [pregnancy-induced] hypertension without significant proteinuria, third trimester: Secondary | ICD-10-CM

## 2019-01-15 DIAGNOSIS — O26619 Liver and biliary tract disorders in pregnancy, unspecified trimester: Secondary | ICD-10-CM | POA: Insufficient documentation

## 2019-01-15 DIAGNOSIS — Z3A32 32 weeks gestation of pregnancy: Secondary | ICD-10-CM

## 2019-01-15 DIAGNOSIS — O365930 Maternal care for other known or suspected poor fetal growth, third trimester, not applicable or unspecified: Secondary | ICD-10-CM

## 2019-01-15 DIAGNOSIS — O0993 Supervision of high risk pregnancy, unspecified, third trimester: Secondary | ICD-10-CM

## 2019-01-15 DIAGNOSIS — O0990 Supervision of high risk pregnancy, unspecified, unspecified trimester: Secondary | ICD-10-CM

## 2019-01-15 DIAGNOSIS — O26643 Intrahepatic cholestasis of pregnancy, third trimester: Secondary | ICD-10-CM

## 2019-01-15 DIAGNOSIS — O09293 Supervision of pregnancy with other poor reproductive or obstetric history, third trimester: Secondary | ICD-10-CM

## 2019-01-15 NOTE — Progress Notes (Signed)
TELEHEALTH OBSTETRICS PRENATAL VIRTUAL VIDEO VISIT ENCOUNTER NOTE  [facility]   I connected with Colleen Coleman on 01/15/19 at  9:15 AM EDT by Web Ex Encounter at home and verified that I am speaking with the correct person using two identifiers.   I discussed the limitations, risks, security and privacy concerns of performing an evaluation and management service virtually and the availability of in person appointments. I also discussed with the patient that there may be a patient responsible charge related to this service. The patient expressed understanding and agreed to proceed. Subjective:  Colleen Coleman is a 28 y.o. [id number] at 6213w3d being seen today for ongoing prenatal care.  She is currently monitored for the following issues for this high-risk pregnancy and has Cholestasis during pregnancy; Supervision of high risk pregnancy, antepartum; History of gestational diabetes; and History of gestational hypertension on their problem list.  Patient reports no complaints.  Contractions: Irritability. Vag. Bleeding: None.  Movement: Present. Denies any leaking of fluid.   The following portions of the patient's history were reviewed and updated as appropriate: allergies, current medications, past family history, past medical history, past social history, past surgical history and problem list.   Objective:   Vitals:   01/15/19 0902  BP: 96/74  Pulse: 91    Fetal Status:     Movement: Present     General:  Alert, oriented and cooperative. Patient is in no acute distress.  Respiratory: Normal respiratory effort, no problems with respiration noted  Mental Status: Normal mood and affect. Normal behavior. Normal judgment and thought content.  Rest of physical exam deferred due to type of encounter  Imaging: [place] Mfm Fetal Bpp Wo Non Stress  Result Date: 01/09/2019  ----------------------------------------------------------------------  OBSTETRICS REPORT                       (Signed Final 01/09/2019 09:48 am) ---------------------------------------------------------------------- Patient Info  ID #:       [id number]                          D.O.B.:  [birth year] (28 yrs)(F)  Name:       Colleen BoozerYURBY DE LEON-                  Visit Date: 01/09/2019 08:41 am              Coleman ---------------------------------------------------------------------- Performed By  Performed By:     Percell BostonHeather Waken          Ref. Address:     [address]                    [place]                                                             [address]  Attending:        Noralee Spaceavi Shankar MD        [facility]  [facility] By[facility]      [facility] ---------------------------------------------------------------------- Orders   #  Description                          Code         Ordered By   1  US MFM FETAL BPP WO NON              76819.01     RAVI SHANKAR      STRESS   2  US MFM OB FOLLOW UP                  76816.01     RAVI SHANKAR   3  US MFM UA CORD DOPPLER               76820.02     RAVI SHANKAR  ----------------------------------------------------------------------   #  Order #                    Accession #                 Episode #   1  [id number]                  [id number]                  [id number]   2  [id number]                  [id number]   3  [id number]                  [id number]                  [id number]  ---------------------------------------------------------------------- Indications   Cholestasis of pregnancy, third trimester      S01.093A35.5O26.613K83.1   Gestational hypertension without significant   O13.3   proteinuria, third trimester   Poor obstetric history: Previous gestational   O09.299   diabetes   [redacted] weeks gestation of pregnancy                Z3A.31   ---------------------------------------------------------------------- Vital Signs                                                 Height:        4'11" ---------------------------------------------------------------------- Fetal Evaluation  Num Of Fetuses:         1  Fetal Heart Rate(bpm):  130  Cardiac Activity:       Observed  Presentation:           Cephalic  Placenta:               Posterior  P. Cord Insertion:      Visualized, central  Amniotic Fluid  AFI FV:      Within normal limits  AFI Sum(cm)     %Tile       Largest Pocket(cm)  10.58           20          3.2  RUQ(cm)       RLQ(cm)       LUQ(cm)  LLQ(cm)  2.63          2.58          3.2            2.17 ---------------------------------------------------------------------- Biophysical Evaluation  Amniotic F.V:   Within normal limits       F. Tone:        Observed  F. Movement:    Observed                   Score:          8/8  F. Breathing:   Observed ---------------------------------------------------------------------- Biometry  BPD:      73.5  mm     G. Age:  29w 3d          2  %    CI:        70.79   %    70 - 86                                                          FL/HC:      20.4   %    19.1 - 21.3  HC:      278.4  mm     G. Age:  30w 3d          3  %    HC/AC:      1.04        0.96 - 1.17  AC:      267.7  mm     G. Age:  30w 6d         27  %    FL/BPD:     77.4   %    71 - 87  FL:       56.9  mm     G. Age:  29w 6d          5  %    FL/AC:      21.3   %    20 - 24  HUM:      50.1  mm     G. Age:  29w 3d        < 5  %  Est. FW:    1567  gm      3 lb 7 oz     10  % ---------------------------------------------------------------------- OB History  Gravidity:    4         Term:   3        Prem:   0        SAB:   0  TOP:          0       Ectopic:  0        Living: 3 ---------------------------------------------------------------------- Gestational Age  LMP:           32w 4d        Date:  05/26/18                 EDD:   03/02/19  U/S Today:      30w 1d  EDD:   03/19/19  Best:          31w 4d     Det. By:  Previous Ultrasound      EDD:   03/09/19                                      (10/09/18) ---------------------------------------------------------------------- Anatomy  Cranium:               Appears normal         Aortic Arch:            Previously seen  Cavum:                 Previously seen        Ductal Arch:            Previously seen  Ventricles:            Appears normal         Diaphragm:              Previously seen  Choroid Plexus:        Previously seen        Stomach:                Appears normal, left                                                                        sided  Cerebellum:            Previously seen        Abdomen:                Previously seen  Posterior Fossa:       Previously seen        Abdominal Wall:         Not well visualized  Nuchal Fold:           Not applicable (>20    Cord Vessels:           Previously seen                         wks GA)  Face:                  Not well visualized    Kidneys:                Previously seen  Lips:                  Previously seen        Bladder:                Appears normal  Thoracic:              Appears normal         Spine:                  Previously seen  Heart:                 Appears normal         Upper  Extremities:      Previously seen                         (4CH, axis, and                         situs)  RVOT:                  Previously seen        Lower Extremities:      Previously seen  LVOT:                  Appears normal  Other:  Heels visualized previously. Nasal bone visualized previously. Right          5th digit visualized previously. ---------------------------------------------------------------------- Doppler - Fetal Vessels  Umbilical Artery   S/D     %tile     RI              PI              PSV    ADFV    RDFV                                                   (cm/s)  2.31       24   0.57             0.78               48.1       N       N ---------------------------------------------------------------------- Cervix Uterus Adnexa  Cervix  Not visualized (advanced GA >24wks)  Uterus  No abnormality visualized.  Left Ovary  No adnexal mass visualized.  Right Ovary  No adnexal mass visualized.  Cul De Sac  No free fluid seen.  Adnexa  No abnormality visualized. ---------------------------------------------------------------------- Impression  Intrahepatic cholestasis of pregnancy. Patient takes ursodiol  300 mg tid.  On ultrasound, the estimated fetal weight is at the 10th  percentile. Fetal weight is estimated by using Hadlock  formula (consistent with SMFM recommendation). Her  previous ultrasound estimated weight by a different formula  Darnelle Bos).  Amniotic fluid is normal and good fetal activity is seen.  Umbilical artery Doppler study showed normal forward  diastolic flow. BPP 8/8.  I explained the findings of possibility of growth restriction or a  constitutionally-small fetus (previous children weighed 2,495,  2,571 and 2,745 g at birth). ---------------------------------------------------------------------- Recommendations  -Continue weekly BPP, NST and Doppler till delivery. ----------------------------------------------------------------------                  Noralee Space, MD Electronically Signed Final Report   01/09/2019 09:48 am ----------------------------------------------------------------------  [place] Mfm Fetal Bpp Wo Non Stress  Result Date: 01/02/2019 ----------------------------------------------------------------------  OBSTETRICS REPORT                       (Signed Final 01/02/2019 09:19 am) ---------------------------------------------------------------------- Patient Info  ID #:       [id number]                          D.O.B.:  [birth year] (28 yrs)(F)  Name:       Colleen Boozer-  Visit Date: 01/02/2019 08:51 am              Coleman  ---------------------------------------------------------------------- Performed By  Performed By:     Tomma Lightning             Ref. Address:     [address]                    [place]                                                             [address]  Attending:        Noralee Space MD        [facility]  Referred By:      [facility] ---------------------------------------------------------------------- Orders   #  Description                          Code         Ordered By   1  [place] MFM FETAL BPP WO NON              76819.01     RAVI [facility]      STRESS  ----------------------------------------------------------------------   #  Order #                    Accession #                 Episode #   1  [id number]                  [id number]                  [id number]  ---------------------------------------------------------------------- Indications   Cholestasis of pregnancy, third trimester      Q65.784O96.2   Gestational hypertension without significant   O13.3   proteinuria, third trimester   [redacted] weeks gestation of pregnancy                Z3A.30   Poor obstetric history: Previous gestational   O09.299   diabetes  ---------------------------------------------------------------------- Vital Signs  Weight (lb): 171                               Height:        4'11"  BMI:         34.53 ---------------------------------------------------------------------- Fetal Evaluation  Num Of Fetuses:         1  Fetal Heart Rate(bpm):  146  Cardiac Activity:       Observed  Presentation:           Breech  Placenta:               Posterior  Amniotic Fluid  AFI FV:  Within normal limits  AFI Sum(cm)     %Tile       Largest Pocket(cm)  11.72           28          3.54  RUQ(cm)       RLQ(cm)       LUQ(cm)        LLQ(cm)  2.44          2.64          3.54           3.1 ----------------------------------------------------------------------  Biophysical Evaluation  Amniotic F.V:   Within normal limits       F. Tone:        Observed  F. Movement:    Observed                   Score:          8/8  F. Breathing:   Observed ---------------------------------------------------------------------- OB History  Gravidity:    4         Term:   3        Prem:   0        SAB:   0  TOP:          0       Ectopic:  0        Living: 3 ---------------------------------------------------------------------- Gestational Age  LMP:           31w 4d        Date:  05/26/18                 EDD:   03/02/19  Best:          30w 4d     Det. By:  Previous Ultrasound      EDD:   03/09/19                                      (10/09/18) ---------------------------------------------------------------------- Cervix Uterus Adnexa  Cervix  Not visualized (advanced GA >24wks) ---------------------------------------------------------------------- Impression  Intrahepatic cholestasis of pregnancy. Patient is taking  ursodiol 300 mg tid.  Amniotic fluid is normal and good fetal activity is seen.  Antenatal testing is reassuring. BPP 8/8. ---------------------------------------------------------------------- Recommendations  -Continue weekly BPP till delivery.  -Fetal growth assessment next week. ----------------------------------------------------------------------                  Noralee Space, MD Electronically Signed Final Report   01/02/2019 09:19 am ----------------------------------------------------------------------  [place] Mfm Fetal Bpp Wo Non Stress  Result Date: 12/25/2018 ----------------------------------------------------------------------  OBSTETRICS REPORT                       (Signed Final 12/25/2018 12:28 pm) ---------------------------------------------------------------------- Patient Info  ID #:       [id number]                          D.O.B.:  29-Oct-1990 (27 yrs)(F)  Name:       Colleen Boozer-                  Visit Date: 12/25/2018 08:54 am              Coleman  ---------------------------------------------------------------------- Performed By  Performed By:     Marcellina Millin  Ref. Address:     [address]                    [place]                                                             [address]  Attending:        Lin Landsman      [facility]  Referred By:      [facility] ---------------------------------------------------------------------- Orders   #  Description                          Code         Ordered By   1  [place] MFM FETAL BPP WO NON              76819.01     RAVI [facility]      STRESS  ----------------------------------------------------------------------   #  Order #                    Accession #                 Episode #   1  [id number]                  [id number]                  [id number]  ---------------------------------------------------------------------- Indications   Cholestasis of pregnancy, second trimester     O26.612K83.1   [redacted] weeks gestation of pregnancy                Z3A.29   Gestational hypertension without significant   O13.2   proteinuria, second trimester   Poor obstetric history: Previous gestational   O09.299   diabetes  ---------------------------------------------------------------------- Fetal Evaluation  Num Of Fetuses:         1  Fetal Heart Rate(bpm):  131  Cardiac Activity:       Observed  Presentation:           Homero Fellers breech  Placenta:               Posterior  Amniotic Fluid  AFI FV:      Within normal limits  AFI Sum(cm)     %Tile       Largest Pocket(cm)  15.22           54          5.18  RUQ(cm)       RLQ(cm)       LUQ(cm)        LLQ(cm)  4.23          2.61          5.18           3.2 ---------------------------------------------------------------------- Biophysical Evaluation  Amniotic F.V:   Within normal limits       F. Tone:        Observed  F. Movement:    Observed                   Score:          8/8  F.  Breathing:   Observed ---------------------------------------------------------------------- OB History  Gravidity:    4         Term:   3        Prem:   0        SAB:   0  TOP:          0       Ectopic:  0        Living: 3 ---------------------------------------------------------------------- Gestational Age  LMP:           30w 3d        Date:  05/26/18                 EDD:   03/02/19  Best:          29w 3d     Det. By:  Previous Ultrasound      EDD:   03/09/19                                      (10/09/18) ---------------------------------------------------------------------- Anatomy  Stomach:               Appears normal, left   Bladder:                Appears normal                         sided ---------------------------------------------------------------------- Impression  Biophysical profile 8/8  Cholestasis in pregnancy. ---------------------------------------------------------------------- Recommendations  Continue weekly testing  Repeat growth in 2 weeks.  Delivery by 37 weeks given diagnosis of ICP> ----------------------------------------------------------------------               Lin Landsman, MD Electronically Signed Final Report   12/25/2018 12:28 pm ----------------------------------------------------------------------  [place] Mfm Ob Follow Up  Result Date: 01/09/2019 ----------------------------------------------------------------------  OBSTETRICS REPORT                       (Signed Final 01/09/2019 09:48 am) ---------------------------------------------------------------------- Patient Info  ID #:       [id number]                          D.O.B.:  April 02, 1991 (28 yrs)(F)  Name:       Colleen Boozer-                  Visit Date: 01/09/2019 08:41 am              Coleman ---------------------------------------------------------------------- Performed By  Performed By:     Percell Boston          Ref. Address:     [address]                    [place]  [address]  Attending:        Noralee Space MD        [facility]  Referred By:      [facility] ---------------------------------------------------------------------- Orders   #  Description                          Code         Ordered By   1  [place] MFM FETAL BPP WO NON              76819.01     RAVI SHANKAR      STRESS   2  [place] MFM OB FOLLOW UP                  76816.01     RAVI SHANKAR   3  [place] MFM UA CORD DOPPLER               76820.02     RAVI [facility]  ----------------------------------------------------------------------   #  Order #                    Accession #                 Episode #   1  [id number]                  [id number]                  [id number]   2  [id number]                  [id number]                  [id number]   3  [id number]                  [id number]                  [id number]  ---------------------------------------------------------------------- Indications   Cholestasis of pregnancy, third trimester      C16.606T01.6   Gestational hypertension without significant   O13.3   proteinuria, third trimester   Poor obstetric history: Previous gestational   O09.299   diabetes   [redacted] weeks gestation of pregnancy                Z3A.31  ---------------------------------------------------------------------- Vital Signs                                                 Height:        4'11" ---------------------------------------------------------------------- Fetal Evaluation  Num Of Fetuses:         1  Fetal Heart Rate(bpm):  130  Cardiac Activity:       Observed  Presentation:           Cephalic  Placenta:  Posterior  P. Cord Insertion:      Visualized, central  Amniotic Fluid  AFI FV:      Within normal limits  AFI Sum(cm)     %Tile       Largest Pocket(cm)  10.58           20          3.2  RUQ(cm)       RLQ(cm)       LUQ(cm)        LLQ(cm)  2.63          2.58          3.2            2.17  ---------------------------------------------------------------------- Biophysical Evaluation  Amniotic F.V:   Within normal limits       F. Tone:        Observed  F. Movement:    Observed                   Score:          8/8  F. Breathing:   Observed ---------------------------------------------------------------------- Biometry  BPD:      73.5  mm     G. Age:  29w 3d          2  %    CI:        70.79   %    70 - 86                                                          FL/HC:      20.4   %    19.1 - 21.3  HC:      278.4  mm     G. Age:  30w 3d          3  %    HC/AC:      1.04        0.96 - 1.17  AC:      267.7  mm     G. Age:  30w 6d         27  %    FL/BPD:     77.4   %    71 - 87  FL:       56.9  mm     G. Age:  29w 6d          5  %    FL/AC:      21.3   %    20 - 24  HUM:      50.1  mm     G. Age:  29w 3d        < 5  %  Est. FW:    1567  gm      3 lb 7 oz     10  % ---------------------------------------------------------------------- OB History  Gravidity:    4         Term:   3        Prem:   0        SAB:   0  TOP:          0       Ectopic:  0        Living: 3 ---------------------------------------------------------------------- Gestational  Age  LMP:           32w 4d        Date:  05/26/18                 EDD:   03/02/19  U/S Today:     30w 1d                                        EDD:   03/19/19  Best:          31w 4d     Det. By:  Previous Ultrasound      EDD:   03/09/19                                      (10/09/18) ---------------------------------------------------------------------- Anatomy  Cranium:               Appears normal         Aortic Arch:            Previously seen  Cavum:                 Previously seen        Ductal Arch:            Previously seen  Ventricles:            Appears normal         Diaphragm:              Previously seen  Choroid Plexus:        Previously seen        Stomach:                Appears normal, left                                                                         sided  Cerebellum:            Previously seen        Abdomen:                Previously seen  Posterior Fossa:       Previously seen        Abdominal Wall:         Not well visualized  Nuchal Fold:           Not applicable (>20    Cord Vessels:           Previously seen                         wks GA)  Face:                  Not well visualized    Kidneys:                Previously seen  Lips:                  Previously seen  Bladder:                Appears normal  Thoracic:              Appears normal         Spine:                  Previously seen  Heart:                 Appears normal         Upper Extremities:      Previously seen                         (4CH, axis, and                         situs)  RVOT:                  Previously seen        Lower Extremities:      Previously seen  LVOT:                  Appears normal  Other:  Heels visualized previously. Nasal bone visualized previously. Right          5th digit visualized previously. ---------------------------------------------------------------------- Doppler - Fetal Vessels  Umbilical Artery   S/D     %tile     RI              PI              PSV    ADFV    RDFV                                                   (cm/s)  2.31       24   0.57             0.78              48.1       N       N ---------------------------------------------------------------------- Cervix Uterus Adnexa  Cervix  Not visualized (advanced GA >24wks)  Uterus  No abnormality visualized.  Left Ovary  No adnexal mass visualized.  Right Ovary  No adnexal mass visualized.  Cul De Sac  No free fluid seen.  Adnexa  No abnormality visualized. ---------------------------------------------------------------------- Impression  Intrahepatic cholestasis of pregnancy. Patient takes ursodiol  300 mg tid.  On ultrasound, the estimated fetal weight is at the 10th  percentile. Fetal weight is estimated by using Hadlock  formula (consistent with SMFM recommendation). Her   previous ultrasound estimated weight by a different formula  Darnelle Bos).  Amniotic fluid is normal and good fetal activity is seen.  Umbilical artery Doppler study showed normal forward  diastolic flow. BPP 8/8.  I explained the findings of possibility of growth restriction or a  constitutionally-small fetus (previous children weighed 2,495,  2,571 and 2,745 g at birth). ---------------------------------------------------------------------- Recommendations  -Continue weekly BPP, NST and Doppler till delivery. ----------------------------------------------------------------------                  Noralee Space, MD Electronically Signed Final Report   01/09/2019 09:48 am ----------------------------------------------------------------------  [place] Mfm Ua Cord Doppler  Result Date: 01/09/2019 ----------------------------------------------------------------------  OBSTETRICS REPORT                       (Signed Final 01/09/2019 09:48 am) ---------------------------------------------------------------------- Patient Info  ID #:       [id number]                          D.O.B.:  1991-02-05 (28 yrs)(F)  Name:       Colleen Child-                  Visit Date: 01/09/2019 08:41 am              Coleman ---------------------------------------------------------------------- Performed By  Performed By:     Valda Favia          Ref. Address:     [address]                                                             [address]  Attending:        Tama High MD        [facility]  Referred By:      [facility] ---------------------------------------------------------------------- Orders   #  Description                          Code         Ordered By   1  [place] MFM FETAL BPP WO NON              76819.01     Graham   2  [place] MFM OB FOLLOW UP                  76816.01     RAVI SHANKAR   3  [place] MFM UA CORD  DOPPLER               67893.81     [facility]  ----------------------------------------------------------------------   #  Order #                    Accession #                 Episode #   1  [id number]   2  [id number]                  [id number]   3  [id number]                  [id number]  ---------------------------------------------------------------------- Indications   Cholestasis of pregnancy, third trimester      E95.284X32.4   Gestational hypertension without significant   O13.3   proteinuria, third trimester   Poor obstetric history: Previous gestational   O09.299   diabetes   [redacted] weeks gestation of pregnancy                Z3A.31  ---------------------------------------------------------------------- Vital Signs                                                 Height:        4'11" ---------------------------------------------------------------------- Fetal Evaluation  Num Of Fetuses:         1  Fetal Heart Rate(bpm):  130  Cardiac Activity:       Observed  Presentation:           Cephalic  Placenta:               Posterior  P. Cord Insertion:      Visualized, central  Amniotic Fluid  AFI FV:      Within normal limits  AFI Sum(cm)     %Tile       Largest Pocket(cm)  10.58           20          3.2  RUQ(cm)       RLQ(cm)       LUQ(cm)        LLQ(cm)  2.63          2.58          3.2            2.17 ---------------------------------------------------------------------- Biophysical Evaluation  Amniotic F.V:   Within normal limits       F. Tone:        Observed  F. Movement:    Observed                   Score:          8/8  F. Breathing:   Observed ---------------------------------------------------------------------- Biometry  BPD:      73.5  mm     G. Age:  29w 3d          2  %    CI:        70.79   %    70 - 86                                                          FL/HC:      20.4   %    19.1 - 21.3   HC:      278.4  mm     G. Age:  30w 3d          3  %    HC/AC:      1.04        0.96 -  1.17  AC:      267.7  mm     G. Age:  30w 6d         27  %    FL/BPD:     77.4   %    71 - 87  FL:       56.9  mm     G. Age:  29w 6d          5  %    FL/AC:      21.3   %    20 - 24  HUM:      50.1  mm     G. Age:  29w 3d        < 5  %  Est. FW:    1567  gm      3 lb 7 oz     10  % ---------------------------------------------------------------------- OB History  Gravidity:    4         Term:   3        Prem:   0        SAB:   0  TOP:          0       Ectopic:  0        Living: 3 ---------------------------------------------------------------------- Gestational Age  LMP:           32w 4d        Date:  05/26/18                 EDD:   03/02/19  U/S Today:     30w 1d                                        EDD:   03/19/19  Best:          31w 4d     Det. By:  Previous Ultrasound      EDD:   03/09/19                                      (10/09/18) ---------------------------------------------------------------------- Anatomy  Cranium:               Appears normal         Aortic Arch:            Previously seen  Cavum:                 Previously seen        Ductal Arch:            Previously seen  Ventricles:            Appears normal         Diaphragm:              Previously seen  Choroid Plexus:        Previously seen        Stomach:                Appears normal, left  sided  Cerebellum:            Previously seen        Abdomen:                Previously seen  Posterior Fossa:       Previously seen        Abdominal Wall:         Not well visualized  Nuchal Fold:           Not applicable (>20    Cord Vessels:           Previously seen                         wks GA)  Face:                  Not well visualized    Kidneys:                Previously seen  Lips:                  Previously seen        Bladder:                Appears normal  Thoracic:               Appears normal         Spine:                  Previously seen  Heart:                 Appears normal         Upper Extremities:      Previously seen                         (4CH, axis, and                         situs)  RVOT:                  Previously seen        Lower Extremities:      Previously seen  LVOT:                  Appears normal  Other:  Heels visualized previously. Nasal bone visualized previously. Right          5th digit visualized previously. ---------------------------------------------------------------------- Doppler - Fetal Vessels  Umbilical Artery   S/D     %tile     RI              PI              PSV    ADFV    RDFV                                                   (cm/s)  2.31       24   0.57             0.78              48.1       N       N ---------------------------------------------------------------------- Cervix  Uterus Adnexa  Cervix  Not visualized (advanced GA >24wks)  Uterus  No abnormality visualized.  Left Ovary  No adnexal mass visualized.  Right Ovary  No adnexal mass visualized.  Cul De Sac  No free fluid seen.  Adnexa  No abnormality visualized. ---------------------------------------------------------------------- Impression  Intrahepatic cholestasis of pregnancy. Patient takes ursodiol  300 mg tid.  On ultrasound, the estimated fetal weight is at the 10th  percentile. Fetal weight is estimated by using Hadlock  formula (consistent with [facility] recommendation). Her  previous ultrasound estimated weight by a different formula  Darnelle Bos).  Amniotic fluid is normal and good fetal activity is seen.  Umbilical artery Doppler study showed normal forward  diastolic flow. BPP 8/8.  I explained the findings of possibility of growth restriction or a  constitutionally-small fetus (previous children weighed 2,495,  2,571 and 2,745 g at birth). ---------------------------------------------------------------------- Recommendations  -Continue weekly BPP, NST and Doppler till delivery.  ----------------------------------------------------------------------                  Noralee Space, MD Electronically Signed Final Report   01/09/2019 09:48 am ----------------------------------------------------------------------   Assessment and Plan:  Pregnancy: [id number] at [redacted]w[redacted]d 1. Supervision of high risk pregnancy, antepartum   2. Cholestasis during pregnancy in third trimester - check bile acids (she can come by for this when she comes for her u/s) - she is taking actigall 3 times per day and is still itching - weekly MFM tsting  Preterm labor symptoms and general obstetric precautions including but not limited to vaginal bleeding, contractions, leaking of fluid and fetal movement were reviewed in detail with the patient. I discussed the assessment and treatment plan with the patient. The patient was provided an opportunity to ask questions and all were answered. The patient agreed with the plan and demonstrated an understanding of the instructions. The patient was advised to call back or seek an in-person office evaluation/go to [facility] at [facility] for any urgent or concerning symptoms. Please refer to After Visit Summary for other counseling recommendations.   I provided 10 minutes of face-to-face time during this encounter.  No follow-ups on file.  Future Appointments  Date Time Provider Department Center  01/15/2019  9:15 AM Allie Bossier, MD WOC-WOCA WOC  01/15/2019 10:00 AM [facility]-MFC NURSE WH-MFC MFC-US  01/15/2019 10:00 AM WH-MFC [place] 3 WH-MFCUS MFC-US  01/15/2019 11:00 AM [facility]-MFC NST WH-MFC MFC-US  01/22/2019  9:45 AM WH-MFC NURSE WH-MFC MFC-US  01/22/2019  9:45 AM WH-MFC [place] 2 WH-MFCUS MFC-US  01/22/2019 10:30 AM WH-MFC NST WH-MFC MFC-US  01/29/2019  9:15 AM WH-MFC [place] 4 WH-MFCUS MFC-US  01/29/2019  9:20 AM WH-MFC NURSE WH-MFC MFC-US  01/29/2019 10:30 AM WH-MFC NST WH-MFC MFC-US  02/05/2019  9:15 AM WH-MFC [place] 4 WH-MFCUS MFC-US  02/05/2019  9:20 AM WH-MFC NURSE WH-MFC MFC-US   02/05/2019 10:15 AM WH-MFC NST WH-MFC MFC-US    Allie Bossier, MD [facility] [employer]

## 2019-01-16 LAB — BILE ACIDS, TOTAL: Bile Acids Total: 5.2 umol/L (ref 0.0–10.0)

## 2019-01-19 ENCOUNTER — Telehealth: Payer: Self-pay | Admitting: Obstetrics & Gynecology

## 2019-01-19 NOTE — Telephone Encounter (Signed)
Attempted to reach patient about her appointment. Left a message for her to call the office.  °

## 2019-01-22 ENCOUNTER — Encounter (HOSPITAL_COMMUNITY): Payer: Self-pay

## 2019-01-22 ENCOUNTER — Ambulatory Visit (INDEPENDENT_AMBULATORY_CARE_PROVIDER_SITE_OTHER): Payer: Self-pay | Admitting: Obstetrics & Gynecology

## 2019-01-22 ENCOUNTER — Other Ambulatory Visit: Payer: Self-pay

## 2019-01-22 ENCOUNTER — Ambulatory Visit (HOSPITAL_COMMUNITY)
Admission: RE | Admit: 2019-01-22 | Discharge: 2019-01-22 | Disposition: A | Discharge status: Home / Self Care | Payer: Self-pay | Source: Ambulatory Visit | Attending: Obstetrics and Gynecology | Admitting: Obstetrics and Gynecology

## 2019-01-22 ENCOUNTER — Ambulatory Visit (HOSPITAL_COMMUNITY): Payer: Self-pay | Admitting: *Deleted

## 2019-01-22 ENCOUNTER — Encounter: Payer: Self-pay | Admitting: Obstetrics & Gynecology

## 2019-01-22 VITALS — BP 115/73 | HR 85 | Wt 171.5 lb

## 2019-01-22 VITALS — BP 116/62 | HR 86 | Temp 97.3°F

## 2019-01-22 DIAGNOSIS — O09293 Supervision of pregnancy with other poor reproductive or obstetric history, third trimester: Secondary | ICD-10-CM

## 2019-01-22 DIAGNOSIS — O26613 Liver and biliary tract disorders in pregnancy, third trimester: Secondary | ICD-10-CM

## 2019-01-22 DIAGNOSIS — O365930 Maternal care for other known or suspected poor fetal growth, third trimester, not applicable or unspecified: Secondary | ICD-10-CM

## 2019-01-22 DIAGNOSIS — O0993 Supervision of high risk pregnancy, unspecified, third trimester: Secondary | ICD-10-CM

## 2019-01-22 DIAGNOSIS — Z3A33 33 weeks gestation of pregnancy: Secondary | ICD-10-CM

## 2019-01-22 DIAGNOSIS — O26619 Liver and biliary tract disorders in pregnancy, unspecified trimester: Secondary | ICD-10-CM | POA: Insufficient documentation

## 2019-01-22 DIAGNOSIS — K831 Obstruction of bile duct: Secondary | ICD-10-CM

## 2019-01-22 DIAGNOSIS — O0990 Supervision of high risk pregnancy, unspecified, unspecified trimester: Secondary | ICD-10-CM

## 2019-01-22 NOTE — Patient Instructions (Signed)
Regrese a la clinica cuando tenga su cita. Si tiene problemas o preguntas, llama a la clinica o vaya a la sala de emerg[facility] al [facility].    

## 2019-01-22 NOTE — Procedures (Signed)
Colleen Coleman 12/07/1990 [redacted]w[redacted]d  Fetus A Non-Stress Test Interpretation for 01/22/19  Indication: IUGR  Fetal Heart Rate A Mode: External Baseline Rate (A): 150 bpm Variability: Moderate Accelerations: 15 x 15 Decelerations: None Multiple birth?: No  Uterine Activity Mode: Toco Contraction Frequency (min): none  Interpretation (Fetal Testing) Nonstress Test Interpretation: Reactive Comments: FHR tracing rev'd by Dr. Donalee Citrin

## 2019-01-22 NOTE — Progress Notes (Signed)
PRENATAL VISIT NOTE  Subjective:  Colleen Coleman is a 28 y.o. [phone] at [redacted]w[redacted]d being seen today for ongoing prenatal care.  She is currently monitored for the following issues for this high-risk pregnancy and has Cholestasis during pregnancy; Supervision of high risk pregnancy, antepartum; History of gestational diabetes; and History of gestational hypertension on their problem list.  Patient reports no complaints.  Contractions: Irregular. Vag. Bleeding: None.  Movement: Present. Denies leaking of fluid.   The following portions of the patient's history were reviewed and updated as appropriate: allergies, current medications, past family history, past medical history, past social history, past surgical history and problem list.   Objective:   Vitals:   01/22/19 0954  BP: 115/73  Pulse: 85  Weight: 171 lb 8 oz (77.8 kg)    Fetal Status: Fetal Heart Rate (bpm): 149   Movement: Present     General:  Alert, oriented and cooperative. Patient is in no acute distress.  Skin: Skin is warm and dry. No rash noted.   Cardiovascular: Normal heart rate noted  Respiratory: Normal respiratory effort, no problems with respiration noted  Abdomen: Soft, gravid, appropriate for gestational age.  Pain/Pressure: Present     Pelvic: Cervical exam deferred        Extremities: Normal range of motion.  Edema: Trace  Mental Status: Normal mood and affect. Normal behavior. Normal judgment and thought content.   Imaging: [place] Mfm Fetal Bpp Wo Non Stress  Result Date: 01/15/2019 ----------------------------------------------------------------------  OBSTETRICS REPORT                       (Signed Final 01/15/2019 11:10 am) ---------------------------------------------------------------------- Patient Info  ID #:       [id number]                          D.O.B.:  05/04/1991 (28 yrs)(F)  Name:       Colleen Boozer-                  Visit Date: 01/15/2019 10:51 am              [facility]  ---------------------------------------------------------------------- Performed By  Performed By:     Eden Lathe BS      Ref. Address:     [address]                    [place] [address]                                                             [address]  Attending:        Noralee Space MD        [facility]  Referred By:      [facility] ---------------------------------------------------------------------- Orders   #  Description  Code         Ordered By   1  [place] MFM FETAL BPP WO NON              [id number]     RAVI SHANKAR      STRESS   2  [place] MFM UA CORD DOPPLER               76820.02     RAVI [facility]  ----------------------------------------------------------------------   #  Order #                    Accession #                 Episode #   1  [id number]                  [id number]                  [id number]   2  [id number]                  [id number]                  [id number]  ---------------------------------------------------------------------- Indications   Encounter for other antenatal screening        Z36.2   follow-up   Cholestasis of pregnancy, third trimester      Q59.563O75.6   Gestational hypertension without significant   O13.3   proteinuria, third trimester   Poor obstetric history: Previous gestational   O09.299   diabetes   Maternal care for known or suspected poor      O36.5930   fetal growth, third trimester, not applicable or   unspecified   [redacted] weeks gestation of pregnancy                Z3A.32  ---------------------------------------------------------------------- Vital Signs                                                 Height:        4'11" ---------------------------------------------------------------------- Fetal Evaluation  Num Of Fetuses:         1  Fetal Heart Rate(bpm):  150  Cardiac Activity:       Observed  Presentation:           Cephalic  Amniotic Fluid  AFI FV:       Within normal limits  AFI Sum(cm)     %Tile       Largest Pocket(cm)  13.56           44          5.17  RUQ(cm)       RLQ(cm)       LUQ(cm)        LLQ(cm)  5.17          2.74          3.85           1.8 ---------------------------------------------------------------------- Biophysical Evaluation  Amniotic F.V:   Within normal limits       F. Tone:        Observed  F. Movement:    Observed                   Score:          8/8  F. Breathing:  Observed ---------------------------------------------------------------------- OB History  Gravidity:    4         Term:   3        Prem:   0        SAB:   0  TOP:          0       Ectopic:  0        Living: 3 ---------------------------------------------------------------------- Gestational Age  LMP:           33w 3d        Date:  05/26/18                 EDD:   03/02/19  Best:          Armida Sans 3d     Det. By:  Previous Ultrasound      EDD:   03/09/19                                      (10/09/18) ---------------------------------------------------------------------- Doppler - Fetal Vessels  Umbilical Artery   S/D     %tile                                            ADFV    RDFV   2.4       34                                                No      No ---------------------------------------------------------------------- Impression  Fetal growth restriction.  Amniotic fluid is normal and good fetal activity is seen.  Umbilical artery Doppler showed normal forward diastolic  flow. Antenatal testing is reassuring. BPP 8/8. ---------------------------------------------------------------------- Recommendations  -Continue weekly BPP and Doppler till delivery. ----------------------------------------------------------------------                  Noralee Space, MD Electronically Signed Final Report   01/15/2019 11:10 am ----------------------------------------------------------------------  [place] Mfm Fetal Bpp Wo Non Stress  Result Date:  01/09/2019 ----------------------------------------------------------------------  OBSTETRICS REPORT                       (Signed Final 01/09/2019 09:48 am) ---------------------------------------------------------------------- Patient Info  ID #:       [id number]                          D.O.B.:  1991/02/04 (28 yrs)(F)  Name:       Colleen Boozer-                  Visit Date: 01/09/2019 08:41 am              [facility] ---------------------------------------------------------------------- Performed By  Performed By:     Percell Boston          Ref. Address:     [address]                    [address]  [address]  Attending:        Noralee Space MD        [facility]  Referred By:      [facility] ---------------------------------------------------------------------- Orders   #  Description                          Code         Ordered By   1  [place] MFM FETAL BPP WO NON              76819.01     RAVI SHANKAR      STRESS   2  [place] MFM OB FOLLOW UP                  76816.01     RAVI SHANKAR   3  [place] MFM UA CORD DOPPLER               76820.02     RAVI [facility]  ----------------------------------------------------------------------   #  Order #                    Accession #                 Episode #   1  [id number]                  [id number]                  [id number]   2  [id number]                  [id number]                  [id number]   3  [id number]                  [id number]                  [id number]  ---------------------------------------------------------------------- Indications   Cholestasis of pregnancy, third trimester      T55.732K02.5   Gestational hypertension without significant   O13.3   proteinuria, third trimester   Poor obstetric history: Previous gestational   O09.299   diabetes   [redacted] weeks gestation of pregnancy                Z3A.31   ---------------------------------------------------------------------- Vital Signs                                                 Height:        4'11" ---------------------------------------------------------------------- Fetal Evaluation  Num Of Fetuses:         1  Fetal Heart Rate(bpm):  130  Cardiac Activity:       Observed  Presentation:           Cephalic  Placenta:  Posterior  P. Cord Insertion:      Visualized, central  Amniotic Fluid  AFI FV:      Within normal limits  AFI Sum(cm)     %Tile       Largest Pocket(cm)  10.58           20          3.2  RUQ(cm)       RLQ(cm)       LUQ(cm)        LLQ(cm)  2.63          2.58          3.2            2.17 ---------------------------------------------------------------------- Biophysical Evaluation  Amniotic F.V:   Within normal limits       F. Tone:        Observed  F. Movement:    Observed                   Score:          8/8  F. Breathing:   Observed ---------------------------------------------------------------------- Biometry  BPD:      73.5  mm     G. Age:  29w 3d          2  %    CI:        70.79   %    70 - 86                                                          FL/HC:      20.4   %    19.1 - 21.3  HC:      278.4  mm     G. Age:  30w 3d          3  %    HC/AC:      1.04        0.96 - 1.17  AC:      267.7  mm     G. Age:  30w 6d         27  %    FL/BPD:     77.4   %    71 - 87  FL:       56.9  mm     G. Age:  29w 6d          5  %    FL/AC:      21.3   %    20 - 24  HUM:      50.1  mm     G. Age:  29w 3d        < 5  %  Est. FW:    1567  gm      3 lb 7 oz     10  % ---------------------------------------------------------------------- OB History  Gravidity:    4         Term:   3        Prem:   0        SAB:   0  TOP:          0       Ectopic:  0        Living: 3 ---------------------------------------------------------------------- Gestational Age  LMP:           32w 4d        Date:  05/26/18                 EDD:   03/02/19  U/S Today:      30w 1d                                        EDD:   03/19/19  Best:          31w 4d     Det. By:  Previous Ultrasound      EDD:   03/09/19                                      (10/09/18) ---------------------------------------------------------------------- Anatomy  Cranium:               Appears normal         Aortic Arch:            Previously seen  Cavum:                 Previously seen        Ductal Arch:            Previously seen  Ventricles:            Appears normal         Diaphragm:              Previously seen  Choroid Plexus:        Previously seen        Stomach:                Appears normal, left                                                                        sided  Cerebellum:            Previously seen        Abdomen:                Previously seen  Posterior Fossa:       Previously seen        Abdominal Wall:         Not well visualized  Nuchal Fold:           Not applicable (>20    Cord Vessels:           Previously seen                         wks GA)  Face:                  Not well visualized    Kidneys:                Previously seen  Lips:                  Previously seen  Bladder:                Appears normal  Thoracic:              Appears normal         Spine:                  Previously seen  Heart:                 Appears normal         Upper Extremities:      Previously seen                         (4CH, axis, and                         situs)  RVOT:                  Previously seen        Lower Extremities:      Previously seen  LVOT:                  Appears normal  Other:  Heels visualized previously. Nasal bone visualized previously. Right          5th digit visualized previously. ---------------------------------------------------------------------- Doppler - Fetal Vessels  Umbilical Artery   S/D     %tile     RI              PI              PSV    ADFV    RDFV                                                   (cm/s)  2.31       24   0.57             0.78               48.1       N       N ---------------------------------------------------------------------- Cervix Uterus Adnexa  Cervix  Not visualized (advanced GA >24wks)  Uterus  No abnormality visualized.  Left Ovary  No adnexal mass visualized.  Right Ovary  No adnexal mass visualized.  Cul De Sac  No free fluid seen.  Adnexa  No abnormality visualized. ---------------------------------------------------------------------- Impression  Intrahepatic cholestasis of pregnancy. Patient takes ursodiol  300 mg tid.  On ultrasound, the estimated fetal weight is at the 10th  percentile. Fetal weight is estimated by using Hadlock  formula (consistent with SM[facility] recommendation). Her  previous ultrasound estimated weight by a different formula  Signa Kell).  Amniotic fluid is normal and good fetal activity is seen.  Umbilical artery Doppler study showed normal forward  diastolic flow. BPP 8/8.  I explained the findings of possibility of growth restriction or a  constitutionally-small fetus (previous children weighed 2,495,  2,571 and 2,745 g at birth). ---------------------------------------------------------------------- Recommendations  -Continue weekly BPP, NST and Doppler till delivery. ----------------------------------------------------------------------                  Tama High, MD Electronically Signed Final Report   01/09/2019 09:48 am ----------------------------------------------------------------------  [place] Mfm Fetal Bpp Wo Non Stress  Result Date:  01/02/2019 ----------------------------------------------------------------------  OBSTETRICS REPORT                       (Signed Final 01/02/2019 09:19 am) ---------------------------------------------------------------------- Patient Info  ID #:       [id number]                          D.O.B.:  July 15, 1990 (28 yrs)(F)  Name:       Colleen Boozer-                  Visit Date: 01/02/2019 08:51 am              [facility]  ---------------------------------------------------------------------- Performed By  Performed By:     Tomma Lightning             Ref. Address:     [address]                    [address]                                                             [address]  Attending:        Noralee Space MD        Location[facility]  Referred By:      [facility] ---------------------------------------------------------------------- Orders   #  Description                          Code         Ordered By   1  [place] MFM FETAL BPP WO NON              76819.01     RAVI [facility]      STRESS  ----------------------------------------------------------------------   #  Order #                    Accession #                 Episode #   1  [id number]                  [id number]                  [id number]  ---------------------------------------------------------------------- Indications   Cholestasis of pregnancy, third trimester      E95.284X32.4   Gestational hypertension without significant   O13.3   proteinuria, third trimester   [redacted] weeks gestation of pregnancy                Z3A.30   Poor obstetric history: Previous gestational   O09.299   diabetes  ---------------------------------------------------------------------- Vital Signs  Weight (lb): 171  Height:        4'11"  BMI:         34.53 ---------------------------------------------------------------------- Fetal Evaluation  Num Of Fetuses:         1  Fetal Heart Rate(bpm):  146  Cardiac Activity:       Observed  Presentation:           Breech  Placenta:               Posterior  Amniotic Fluid  AFI FV:      Within normal limits  AFI Sum(cm)     %Tile       Largest Pocket(cm)  11.72           28          3.54  RUQ(cm)       RLQ(cm)       LUQ(cm)        LLQ(cm)  2.44          2.64          3.54           3.1 ----------------------------------------------------------------------  Biophysical Evaluation  Amniotic F.V:   Within normal limits       F. Tone:        Observed  F. Movement:    Observed                   Score:          8/8  F. Breathing:   Observed ---------------------------------------------------------------------- OB History  Gravidity:    4         Term:   3        Prem:   0        SAB:   0  TOP:          0       Ectopic:  0        Living: 3 ---------------------------------------------------------------------- Gestational Age  LMP:           31w 4d        Date:  05/26/18                 EDD:   03/02/19  Best:          30w 4d     Det. By:  Previous Ultrasound      EDD:   03/09/19                                      (10/09/18) ---------------------------------------------------------------------- Cervix Uterus Adnexa  Cervix  Not visualized (advanced GA >24wks) ---------------------------------------------------------------------- Impression  Intrahepatic cholestasis of pregnancy. Patient is taking  ursodiol 300 mg tid.  Amniotic fluid is normal and good fetal activity is seen.  Antenatal testing is reassuring. BPP 8/8. ---------------------------------------------------------------------- Recommendations  -Continue weekly BPP till delivery.  -Fetal growth assessment next week. ----------------------------------------------------------------------                  Noralee Space, MD Electronically Signed Final Report   01/02/2019 09:19 am ----------------------------------------------------------------------  [place] Mfm Fetal Bpp Wo Non Stress  Result Date: 12/25/2018 ----------------------------------------------------------------------  OBSTETRICS REPORT                       (Signed Final 12/25/2018 12:28 pm) ---------------------------------------------------------------------- Patient Info  ID #:       [id number]  D.O.B.:  Aug 25, 1990 (27 yrs)(F)  Name:       LAURAL EILAND-                  Visit Date: 12/25/2018 08:54 am              [facility]  ---------------------------------------------------------------------- Performed By  Performed By:     Marcellina Millin          Ref. Address:     [address]                    [address]                                                             [address]  Attending:        Lin Landsman      Location[facility]                    MD                                       [facility]etal Care  Referred By:      [facility] ---------------------------------------------------------------------- Orders   #  Description                          Code         Ordered By   1  [place] MFM FETAL BPP WO NON              76819.01     RAVI [facility]      STRESS  ----------------------------------------------------------------------   #  Order #                    Accession #                 Episode #   1  [id number]                  [id number]                  [id number]  ---------------------------------------------------------------------- Indications   Cholestasis of pregnancy, second trimester     O26.612K83.1   [redacted] weeks gestation of pregnancy                Z3A.29   Gestational hypertension without significant   O13.2   proteinuria, second trimester   Poor obstetric history: Previous gestational   O09.299   diabetes  ---------------------------------------------------------------------- Fetal Evaluation  Num Of Fetuses:         1  Fetal Heart Rate(bpm):  131  Cardiac Activity:       Observed  Presentation:           Homero Fellers breech  Placenta:               Posterior  Amniotic Fluid  AFI FV:      Within normal limits  AFI Sum(cm)     %Tile       Largest Pocket(cm)  15.22  54          5.18  RUQ(cm)       RLQ(cm)       LUQ(cm)        LLQ(cm)  4.23          2.61          5.18           3.2 ---------------------------------------------------------------------- Biophysical Evaluation  Amniotic F.V:   Within normal limits       F. Tone:        Observed  F. Movement:    Observed                   Score:          8/8  F.  Breathing:   Observed ---------------------------------------------------------------------- OB History  Gravidity:    4         Term:   3        Prem:   0        SAB:   0  TOP:          0       Ectopic:  0        Living: 3 ---------------------------------------------------------------------- Gestational Age  LMP:           30w 3d        Date:  05/26/18                 EDD:   03/02/19  Best:          29w 3d     Det. By:  Previous Ultrasound      EDD:   03/09/19                                      (10/09/18) ---------------------------------------------------------------------- Anatomy  Stomach:               Appears normal, left   Bladder:                Appears normal                         sided ---------------------------------------------------------------------- Impression  Biophysical profile 8/8  Cholestasis in pregnancy. ---------------------------------------------------------------------- Recommendations  Continue weekly testing  Repeat growth in 2 weeks.  Delivery by 37 weeks given diagnosis of ICP> ----------------------------------------------------------------------               Lin Landsmanorenthian Booker, MD Electronically Signed Final Report   12/25/2018 12:28 pm ----------------------------------------------------------------------  [place] Mfm Ob Follow Up  Result Date: 01/09/2019 ----------------------------------------------------------------------  OBSTETRICS REPORT                       (Signed Final 01/09/2019 09:48 am) ---------------------------------------------------------------------- Patient Info  ID #:       [id number]                          D.O.B.:  26-Sep-1990 (28 yrs)(F)  Name:       Colleen BoozerYURBY DE LEON-                  Visit Date: 01/09/2019 08:41 am              [facility] ---------------------------------------------------------------------- Performed By  Performed By:     Percell BostonHeather Waken  Ref. Address:     [address]                    [address]                                                              [address]  Attending:        Noralee Space MD        [facility]  Referred By:      [facility] ---------------------------------------------------------------------- Orders   #  Description                          Code         Ordered By   1  [place] MFM FETAL BPP WO NON              76819.01     RAVI SHANKAR      STRESS   2  [place] MFM OB FOLLOW UP                  76816.01     RAVI SHANKAR   3  [place] MFM UA CORD DOPPLER               76820.02     RAVI [facility]  ----------------------------------------------------------------------   #  Order #                    Accession #                 Episode #   1  [id number]                  [id number]                  [id number]   2  [id number]                  [id number]                  [id number]   3  [id number]                  [id number]                  [id number]  ---------------------------------------------------------------------- Indications   Cholestasis of pregnancy, third trimester      C16.606T01.6   Gestational hypertension without significant   O13.3   proteinuria, third trimester   Poor obstetric history: Previous gestational   O09.299   diabetes   [redacted] weeks gestation of pregnancy                Z3A.31  ---------------------------------------------------------------------- Vital Signs  Height:        4'11" ---------------------------------------------------------------------- Fetal Evaluation  Num Of Fetuses:         1  Fetal Heart Rate(bpm):  130  Cardiac Activity:       Observed  Presentation:           Cephalic  Placenta:               Posterior  P. Cord Insertion:      Visualized, central  Amniotic Fluid  AFI FV:      Within normal limits  AFI Sum(cm)     %Tile       Largest Pocket(cm)  10.58           20          3.2  RUQ(cm)       RLQ(cm)       LUQ(cm)        LLQ(cm)  2.63          2.58          3.2            2.17  ---------------------------------------------------------------------- Biophysical Evaluation  Amniotic F.V:   Within normal limits       F. Tone:        Observed  F. Movement:    Observed                   Score:          8/8  F. Breathing:   Observed ---------------------------------------------------------------------- Biometry  BPD:      73.5  mm     G. Age:  29w 3d          2  %    CI:        70.79   %    70 - 86                                                          FL/HC:      20.4   %    19.1 - 21.3  HC:      278.4  mm     G. Age:  30w 3d          3  %    HC/AC:      1.04        0.96 - 1.17  AC:      267.7  mm     G. Age:  30w 6d         27  %    FL/BPD:     77.4   %    71 - 87  FL:       56.9  mm     G. Age:  29w 6d          5  %    FL/AC:      21.3   %    20 - 24  HUM:      50.1  mm     G. Age:  29w 3d        < 5  %  Est. FW:    1567  gm      3 lb 7 oz     10  % ---------------------------------------------------------------------- OB History  Gravidity:  4         Term:   3        Prem:   0        SAB:   0  TOP:          0       Ectopic:  0        Living: 3 ---------------------------------------------------------------------- Gestational Age  LMP:           32w 4d        Date:  05/26/18                 EDD:   03/02/19  U/S Today:     30w 1d                                        EDD:   03/19/19  Best:          31w 4d     Det. By:  Previous Ultrasound      EDD:   03/09/19                                      (10/09/18) ---------------------------------------------------------------------- Anatomy  Cranium:               Appears normal         Aortic Arch:            Previously seen  Cavum:                 Previously seen        Ductal Arch:            Previously seen  Ventricles:            Appears normal         Diaphragm:              Previously seen  Choroid Plexus:        Previously seen        Stomach:                Appears normal, left                                                                         sided  Cerebellum:            Previously seen        Abdomen:                Previously seen  Posterior Fossa:       Previously seen        Abdominal Wall:         Not well visualized  Nuchal Fold:           Not applicable (>20    Cord Vessels:           Previously seen                         wks GA)  Face:  Not well visualized    Kidneys:                Previously seen  Lips:                  Previously seen        Bladder:                Appears normal  Thoracic:              Appears normal         Spine:                  Previously seen  Heart:                 Appears normal         Upper Extremities:      Previously seen                         (4CH, axis, and                         situs)  RVOT:                  Previously seen        Lower Extremities:      Previously seen  LVOT:                  Appears normal  Other:  Heels visualized previously. Nasal bone visualized previously. Right          5th digit visualized previously. ---------------------------------------------------------------------- Doppler - Fetal Vessels  Umbilical Artery   S/D     %tile     RI              PI              PSV    ADFV    RDFV                                                   (cm/s)  2.31       24   0.57             0.78              48.1       N       N ---------------------------------------------------------------------- Cervix Uterus Adnexa  Cervix  Not visualized (advanced GA >24wks)  Uterus  No abnormality visualized.  Left Ovary  No adnexal mass visualized.  Right Ovary  No adnexal mass visualized.  Cul De Sac  No free fluid seen.  Adnexa  No abnormality visualized. ---------------------------------------------------------------------- Impression  Intrahepatic cholestasis of pregnancy. Patient takes ursodiol  300 mg tid.  On ultrasound, the estimated fetal weight is at the 10th  percentile. Fetal weight is estimated by using Hadlock  formula (consistent with [facility] recommendation). Her   previous ultrasound estimated weight by a different formula  Darnelle Bos([place]).  Amniotic fluid is normal and good fetal activity is seen.  Umbilical artery Doppler study showed normal forward  diastolic flow. BPP 8/8.  I explained the findings of possibility of growth restriction or a  constitutionally-small fetus (previous children weighed 2,495,  2,571 and 2,745 g at birth). ----------------------------------------------------------------------  Recommendations  -Continue weekly BPP, NST and Doppler till delivery. ----------------------------------------------------------------------                  Noralee Space, MD Electronically Signed Final Report   01/09/2019 09:48 am ----------------------------------------------------------------------  [place] Mfm Ua Cord Doppler  Result Date: 01/15/2019 ----------------------------------------------------------------------  OBSTETRICS REPORT                       (Signed Final 01/15/2019 11:10 am) ---------------------------------------------------------------------- Patient Info  ID #:       [id number]                          D.O.B.:  [birth year] (28 yrs)(F)  Name:       Colleen Boozer-                  Visit Date: 01/15/2019 10:51 am              [facility] ---------------------------------------------------------------------- Performed By  Performed By:     Eden Lathe BS      Ref. Address:     [address]                    [place] [address]                                                             [address]  Attending:        Noralee Space MD        Location[facility]  Referred By:      [facility] ---------------------------------------------------------------------- Orders   #  Description                          Code         Ordered By   1  [place] MFM FETAL BPP WO NON              76819.01     RAVI SHANKAR      STRESS   2  [place] MFM UA CORD DOPPLER               40981.19     RAVI [facility]   ----------------------------------------------------------------------   #  Order #                    Accession #                 Episode #   1  [id number]                  [id number]                  [id number]   2  [id number]  [id number]                  [id number]  ---------------------------------------------------------------------- Indications   Encounter for other antenatal screening        Z36.2   follow-up   Cholestasis of pregnancy, third trimester      O26.613K83.1   Gestational hypertension without significant   O13.3   proteinuria, third trimester   Poor obstetric history: Previous gestational   O09.299   diabetes   Maternal care for known or suspected poor      O36.5930   fetal growth, third trimester, not applicable or   unspecified   [redacted] weeks gestation of pregnancy                Z3A.32  ---------------------------------------------------------------------- Vital Signs                                                 Height:        4'11" ---------------------------------------------------------------------- Fetal Evaluation  Num Of Fetuses:         1  Fetal Heart Rate(bpm):  150  Cardiac Activity:       Observed  Presentation:           Cephalic  Amniotic Fluid  AFI FV:      Within normal limits  AFI Sum(cm)     %Tile       Largest Pocket(cm)  13.56           44          5.17  RUQ(cm)       RLQ(cm)       LUQ(cm)        LLQ(cm)  5.17          2.74          3.85           1.8 ---------------------------------------------------------------------- Biophysical Evaluation  Amniotic F.V:   Within normal limits       F. Tone:        Observed  F. Movement:    Observed                   Score:          8/8  F. Breathing:   Observed ---------------------------------------------------------------------- OB History  Gravidity:    4         Term:   3        Prem:   0        SAB:   0  TOP:          0       Ectopic:  0        Living: 3  ---------------------------------------------------------------------- Gestational Age  LMP:           33w 3d        Date:  05/26/18                 EDD:   03/02/19  Best:          Armida Sans 3d     Det. By:  Previous Ultrasound      EDD:   03/09/19                                      (  10/09/18) ---------------------------------------------------------------------- Doppler - Fetal Vessels  Umbilical Artery   S/D     %tile                                            ADFV    RDFV   2.4       34                                                No      No ---------------------------------------------------------------------- Impression  Fetal growth restriction.  Amniotic fluid is normal and good fetal activity is seen.  Umbilical artery Doppler showed normal forward diastolic  flow. Antenatal testing is reassuring. BPP 8/8. ---------------------------------------------------------------------- Recommendations  -Continue weekly BPP and Doppler till delivery. ----------------------------------------------------------------------                  Noralee Spaceavi Shankar, MD Electronically Signed Final Report   01/15/2019 11:10 am ----------------------------------------------------------------------  [place] Mfm Ua Cord Doppler  Result Date: 01/09/2019 ----------------------------------------------------------------------  OBSTETRICS REPORT                       (Signed Final 01/09/2019 09:48 am) ---------------------------------------------------------------------- Patient Info  ID #:       [id number]                          D.O.B.:  07-18-1990 (28 yrs)(F)  Name:       Colleen BoozerYURBY DE LEON-                  Visit Date: 01/09/2019 08:41 am              [facility] ---------------------------------------------------------------------- Performed By  Performed By:     Percell BostonHeather Waken          Ref. Address:     [address]                    [place]                                                             [address]  Attending:        Noralee Spaceavi Shankar MD         Location[facility]  [facility]      [facility] ---------------------------------------------------------------------- Orders   #  Description                          Code         Ordered By   1  US MFM  FETAL BPP WO NON              76819.01     RAVI SHANKAR      STRESS   2  [place] MFM OB FOLLOW UP                  76816.01     RAVI SHANKAR   3  [place] MFM UA CORD DOPPLER               76820.02     RAVI SHANKAR  ----------------------------------------------------------------------   #  Order #                    Accession #                 Episode #   1  [id number]                  [id number]                  [id number]   2  [id number]                  [id number]                  [id number]   3  [id number]                  [id number]                  [id number]  ---------------------------------------------------------------------- Indications   Cholestasis of pregnancy, third trimester      C16.606T01.6   Gestational hypertension without significant   O13.3   proteinuria, third trimester   Poor obstetric history: Previous gestational   O09.299   diabetes   [redacted] weeks gestation of pregnancy                Z3A.31  ---------------------------------------------------------------------- Vital Signs                                                 Height:        4'11" ---------------------------------------------------------------------- Fetal Evaluation  Num Of Fetuses:         1  Fetal Heart Rate(bpm):  130  Cardiac Activity:       Observed  Presentation:           Cephalic  Placenta:               Posterior  P. Cord Insertion:      Visualized, central  Amniotic Fluid  AFI FV:      Within normal limits  AFI Sum(cm)     %Tile       Largest Pocket(cm)  10.58           20          3.2  RUQ(cm)       RLQ(cm)       LUQ(cm)        LLQ(cm)  2.63          2.58          3.2            2.17  ---------------------------------------------------------------------- Biophysical Evaluation  Amniotic F.V:   Within normal limits       F. Tone:  Observed  F. Movement:    Observed                   Score:          8/8  F. Breathing:   Observed ---------------------------------------------------------------------- Biometry  BPD:      73.5  mm     G. Age:  29w 3d          2  %    CI:        70.79   %    70 - 86                                                          FL/HC:      20.4   %    19.1 - 21.3  HC:      278.4  mm     G. Age:  30w 3d          3  %    HC/AC:      1.04        0.96 - 1.17  AC:      267.7  mm     G. Age:  30w 6d         27  %    FL/BPD:     77.4   %    71 - 87  FL:       56.9  mm     G. Age:  29w 6d          5  %    FL/AC:      21.3   %    20 - 24  HUM:      50.1  mm     G. Age:  29w 3d        < 5  %  Est. FW:    1567  gm      3 lb 7 oz     10  % ---------------------------------------------------------------------- OB History  Gravidity:    4         Term:   3        Prem:   0        SAB:   0  TOP:          0       Ectopic:  0        Living: 3 ---------------------------------------------------------------------- Gestational Age  LMP:           32w 4d        Date:  05/26/18                 EDD:   03/02/19  U/S Today:     30w 1d                                        EDD:   03/19/19  Best:          31w 4d     Det. By:  Previous Ultrasound      EDD:   03/09/19                                      (  10/09/18) ---------------------------------------------------------------------- Anatomy  Cranium:               Appears normal         Aortic Arch:            Previously seen  Cavum:                 Previously seen        Ductal Arch:            Previously seen  Ventricles:            Appears normal         Diaphragm:              Previously seen  Choroid Plexus:        Previously seen        Stomach:                Appears normal, left                                                                         sided  Cerebellum:            Previously seen        Abdomen:                Previously seen  Posterior Fossa:       Previously seen        Abdominal Wall:         Not well visualized  Nuchal Fold:           Not applicable (>20    Cord Vessels:           Previously seen                         wks GA)  Face:                  Not well visualized    Kidneys:                Previously seen  Lips:                  Previously seen        Bladder:                Appears normal  Thoracic:              Appears normal         Spine:                  Previously seen  Heart:                 Appears normal         Upper Extremities:      Previously seen                         (4CH, axis, and                         situs)  RVOT:  Previously seen        Lower Extremities:      Previously seen  LVOT:                  Appears normal  Other:  Heels visualized previously. Nasal bone visualized previously. Right          5th digit visualized previously. ---------------------------------------------------------------------- Doppler - Fetal Vessels  Umbilical Artery   S/D     %tile     RI              PI              PSV    ADFV    RDFV                                                   (cm/s)  2.31       24   0.57             0.78              48.1       N       N ---------------------------------------------------------------------- Cervix Uterus Adnexa  Cervix  Not visualized (advanced GA >24wks)  Uterus  No abnormality visualized.  Left Ovary  No adnexal mass visualized.  Right Ovary  No adnexal mass visualized.  Cul De Sac  No free fluid seen.  Adnexa  No abnormality visualized. ---------------------------------------------------------------------- Impression  Intrahepatic cholestasis of pregnancy. Patient takes ursodiol  300 mg tid.  On ultrasound, the estimated fetal weight is at the 10th  percentile. Fetal weight is estimated by using Hadlock  formula (consistent with [facility] recommendation). Her   previous ultrasound estimated weight by a different formula  Darnelle Bos).  Amniotic fluid is normal and good fetal activity is seen.  Umbilical artery Doppler study showed normal forward  diastolic flow. BPP 8/8.  I explained the findings of possibility of growth restriction or a  constitutionally-small fetus (previous children weighed 2,495,  2,571 and 2,745 g at birth). ---------------------------------------------------------------------- Recommendations  -Continue weekly BPP, NST and Doppler till delivery. ----------------------------------------------------------------------                  Noralee Space, MD Electronically Signed Final Report   01/09/2019 09:48 am ----------------------------------------------------------------------   Assessment and Plan:  Pregnancy: [id number] at [redacted]w[redacted]d 1. Cholestasis during pregnancy, antepartum Doing well on medications. Continue weekly BPP and studies as per MFM; there is also concern about FGR, also getting weekly dopplers.  2. Supervision of high risk pregnancy, antepartum IOL to be scheduled at next visit Preterm labor symptoms and general obstetric precautions including but not limited to vaginal bleeding, contractions, leaking of fluid and fetal movement were reviewed in detail with the patient. Please refer to After Visit Summary for other counseling recommendations.   Return in about 2 weeks (around 02/05/2019) for OFFICE [facility] Visit and schedule IOL (has MFM scan same day).  Future Appointments  Date Time Provider Department Center  01/22/2019 10:30 AM [facility] NST [facility] MFC-US  01/29/2019  9:15 AM [facility] [place] 4 WH-MFCUS MFC-US  01/29/2019  9:20 AM [facility] NURSE [facility]  01/29/2019 10:30 AM [facility] NST [facility]-US  02/05/2019  9:15 AM [facility]-MFC [place] 4 WH-MFCUS MFC-US  02/05/2019  9:20 AM [facility]-M[facility] NURSE WH-M[facility] MFC-US  02/05/2019 10:15 AM [facility]-M[facility] NST WH-MFC MFC-US  Verita Schneiders, MD

## 2019-01-29 ENCOUNTER — Other Ambulatory Visit: Payer: Self-pay

## 2019-01-29 ENCOUNTER — Ambulatory Visit (HOSPITAL_COMMUNITY)
Admission: RE | Admit: 2019-01-29 | Discharge: 2019-01-29 | Disposition: A | Discharge status: Home / Self Care | Payer: Self-pay | Source: Ambulatory Visit | Attending: Obstetrics and Gynecology | Admitting: Obstetrics and Gynecology

## 2019-01-29 ENCOUNTER — Ambulatory Visit (HOSPITAL_COMMUNITY): Payer: Self-pay | Admitting: *Deleted

## 2019-01-29 ENCOUNTER — Encounter (HOSPITAL_COMMUNITY): Payer: Self-pay

## 2019-01-29 ENCOUNTER — Other Ambulatory Visit (HOSPITAL_COMMUNITY): Payer: Self-pay | Admitting: *Deleted

## 2019-01-29 DIAGNOSIS — O0990 Supervision of high risk pregnancy, unspecified, unspecified trimester: Secondary | ICD-10-CM

## 2019-01-29 DIAGNOSIS — O26613 Liver and biliary tract disorders in pregnancy, third trimester: Secondary | ICD-10-CM

## 2019-01-29 DIAGNOSIS — O365930 Maternal care for other known or suspected poor fetal growth, third trimester, not applicable or unspecified: Secondary | ICD-10-CM

## 2019-01-29 DIAGNOSIS — O26619 Liver and biliary tract disorders in pregnancy, unspecified trimester: Secondary | ICD-10-CM | POA: Insufficient documentation

## 2019-01-29 DIAGNOSIS — O09293 Supervision of pregnancy with other poor reproductive or obstetric history, third trimester: Secondary | ICD-10-CM

## 2019-01-29 DIAGNOSIS — Z3A34 34 weeks gestation of pregnancy: Secondary | ICD-10-CM

## 2019-01-29 DIAGNOSIS — K831 Obstruction of bile duct: Secondary | ICD-10-CM

## 2019-01-29 NOTE — Procedures (Signed)
Colleen Coleman June 01, 1991 [redacted]w[redacted]d  Fetus A Non-Stress Test Interpretation for 01/29/19  Indication: IUGR  Fetal Heart Rate A Mode: External Baseline Rate (A): 140 bpm Variability: Moderate Accelerations: 15 x 15 Decelerations: None Multiple birth?: No  Uterine Activity Mode: Toco Contraction Frequency (min): one UC noted Contraction Duration (sec): 60 Contraction Quality: Mild Resting Tone Palpated: Relaxed Resting Time: Adequate  Interpretation (Fetal Testing) Nonstress Test Interpretation: Reactive Comments: FHR tracing rev'd by Dr. Donalee Citrin

## 2019-02-02 ENCOUNTER — Telehealth: Payer: Self-pay | Admitting: Obstetrics & Gynecology

## 2019-02-02 NOTE — Telephone Encounter (Signed)
The patient called in stating she fell and twisted her leg. She also stated she is now experiencing pain in her vagina. Informed the patient of visiting [facility]. She stated there is no bleeding or leaking of fluid just pain, does she have to go since she has an appointment Monday. She stated she will try to take tylenol to stop the pain as she fell with her legs open and she thinks that's why her vagina hurts. Informed the patient if the pain persists or is not tolerable please go to [facility]. Also sending a message to the nurse.

## 2019-02-05 ENCOUNTER — Other Ambulatory Visit: Payer: Self-pay

## 2019-02-05 ENCOUNTER — Other Ambulatory Visit (HOSPITAL_COMMUNITY)
Admission: RE | Admit: 2019-02-05 | Discharge: 2019-02-05 | Disposition: A | Discharge status: Home / Self Care | Payer: Medicaid Other | Source: Ambulatory Visit | Attending: Obstetrics & Gynecology | Admitting: Obstetrics & Gynecology

## 2019-02-05 ENCOUNTER — Ambulatory Visit (HOSPITAL_COMMUNITY): Payer: Self-pay

## 2019-02-05 ENCOUNTER — Ambulatory Visit (HOSPITAL_COMMUNITY)
Admission: RE | Admit: 2019-02-05 | Discharge: 2019-02-05 | Disposition: A | Discharge status: Home / Self Care | Payer: Self-pay | Source: Ambulatory Visit | Attending: Obstetrics and Gynecology | Admitting: Obstetrics and Gynecology

## 2019-02-05 ENCOUNTER — Other Ambulatory Visit (HOSPITAL_COMMUNITY): Payer: Self-pay | Admitting: *Deleted

## 2019-02-05 ENCOUNTER — Ambulatory Visit (INDEPENDENT_AMBULATORY_CARE_PROVIDER_SITE_OTHER): Payer: Self-pay | Admitting: Obstetrics & Gynecology

## 2019-02-05 ENCOUNTER — Encounter (HOSPITAL_COMMUNITY): Payer: Self-pay | Admitting: *Deleted

## 2019-02-05 ENCOUNTER — Ambulatory Visit (HOSPITAL_COMMUNITY): Payer: Self-pay | Admitting: *Deleted

## 2019-02-05 VITALS — Wt 176.9 lb

## 2019-02-05 DIAGNOSIS — O0993 Supervision of high risk pregnancy, unspecified, third trimester: Secondary | ICD-10-CM

## 2019-02-05 DIAGNOSIS — O365990 Maternal care for other known or suspected poor fetal growth, unspecified trimester, not applicable or unspecified: Secondary | ICD-10-CM

## 2019-02-05 DIAGNOSIS — O26613 Liver and biliary tract disorders in pregnancy, third trimester: Secondary | ICD-10-CM

## 2019-02-05 DIAGNOSIS — O0990 Supervision of high risk pregnancy, unspecified, unspecified trimester: Secondary | ICD-10-CM

## 2019-02-05 DIAGNOSIS — Z3A35 35 weeks gestation of pregnancy: Secondary | ICD-10-CM

## 2019-02-05 DIAGNOSIS — O09293 Supervision of pregnancy with other poor reproductive or obstetric history, third trimester: Secondary | ICD-10-CM

## 2019-02-05 DIAGNOSIS — Z8632 Personal history of gestational diabetes: Secondary | ICD-10-CM

## 2019-02-05 DIAGNOSIS — K831 Obstruction of bile duct: Secondary | ICD-10-CM

## 2019-02-05 DIAGNOSIS — O365930 Maternal care for other known or suspected poor fetal growth, third trimester, not applicable or unspecified: Secondary | ICD-10-CM

## 2019-02-05 DIAGNOSIS — O26619 Liver and biliary tract disorders in pregnancy, unspecified trimester: Secondary | ICD-10-CM | POA: Insufficient documentation

## 2019-02-05 MED ORDER — ACETAMINOPHEN 500 MG PO TABS
500.0000 mg | ORAL_TABLET | Freq: Once | ORAL | Status: AC
  Administered 2019-02-05: 500 mg via ORAL

## 2019-02-05 NOTE — Addendum Note (Signed)
Addended by: Alric Seton on: 02/05/2019 10:00 AM   Modules accepted: Orders

## 2019-02-05 NOTE — Progress Notes (Signed)
   PRENATAL VISIT NOTE  Subjective:  Colleen Coleman is a 27 y.o. [phone] at [redacted]w[redacted]d being seen today for ongoing prenatal care.  She is currently monitored for the following issues for this high-risk pregnancy and has Cholestasis during pregnancy; Supervision of high risk pregnancy, antepartum; History of gestational diabetes; and History of gestational hypertension on their problem list.  Patient reports no complaints.  Contractions: Irregular. Vag. Bleeding: None.  Movement: Present. Denies leaking of fluid.   The following portions of the patient's history were reviewed and updated as appropriate: allergies, current medications, past family history, past medical history, past social history, past surgical history and problem list.   Objective:   Vitals:   02/05/19 0922  Weight: 176 lb 14.4 oz (80.2 kg)    Fetal Status:     Movement: Present     General:  Alert, oriented and cooperative. Patient is in no acute distress.  Skin: Skin is warm and dry. No rash noted.   Cardiovascular: Normal heart rate noted  Respiratory: Normal respiratory effort, no problems with respiration noted  Abdomen: Soft, gravid, appropriate for gestational age.  Pain/Pressure: Present     Pelvic: Cervical exam performed        Extremities: Normal range of motion.  Edema: Trace  Mental Status: Normal mood and affect. Normal behavior. Normal judgment and thought content.   Assessment and Plan:  Pregnancy: G4P3003 at [redacted]w[redacted]d 1. Cholestasis during pregnancy, antepartum - on actigall - IOL at 37 weeks (orders placed)  2. Supervision of high risk pregnancy, antepartum  - Culture, beta strep (group b only) - Cervicovaginal ancillary only( [place])  3. History of gestational diabetes - had a normal 2 hour GTT  Preterm labor symptoms and general obstetric precautions including but not limited to vaginal bleeding, contractions, leaking of fluid and fetal movement were reviewed in detail with the  patient. Please refer to After Visit Summary for other counseling recommendations.   Return in about 1 week (around 02/12/2019) for with BPP.  Future Appointments  Date Time Provider [place]  02/05/2019 10:15 AM [place] NST [facility] M[facility]  02/05/2019 11:00 AM WH-MFC NST [place] MFC-US  02/12/2019  8:00 AM WH-MFC [place] 3 WH-[facility]FCUS MFC-US    Emily Filbert, MD

## 2019-02-05 NOTE — Procedures (Signed)
Colleen Coleman 12/29/1990 [redacted]w[redacted]d  Fetus A Non-Stress Test Interpretation for 02/05/19  Indication: IUGR  Fetal Heart Rate A Mode: External Baseline Rate (A): 150 bpm Variability: Moderate Accelerations: 15 x 15 Decelerations: Variable Multiple birth?: No  Uterine Activity Mode: Palpation, Toco Contraction Frequency (min): none Resting Tone Palpated: Relaxed Resting Time: Adequate  Interpretation (Fetal Testing) Nonstress Test Interpretation: Reactive Comments: FHR tracing rev'd by Dr. Donalee Citrin

## 2019-02-06 LAB — CERVICOVAGINAL ANCILLARY ONLY
Chlamydia: NEGATIVE
Neisseria Gonorrhea: NEGATIVE

## 2019-02-06 NOTE — Telephone Encounter (Signed)
Per chart review, pt had face to face visit on 8/3 with Dr. Hulan Fray as well as [place] @ [facility] for BPP and cord doppler.

## 2019-02-07 ENCOUNTER — Telehealth (HOSPITAL_COMMUNITY): Payer: Self-pay | Admitting: *Deleted

## 2019-02-07 NOTE — Telephone Encounter (Signed)
Preadmission screen  

## 2019-02-08 ENCOUNTER — Encounter (HOSPITAL_COMMUNITY): Payer: Self-pay | Admitting: *Deleted

## 2019-02-08 ENCOUNTER — Telehealth (HOSPITAL_COMMUNITY): Payer: Self-pay | Admitting: *Deleted

## 2019-02-08 LAB — CULTURE, BETA STREP (GROUP B ONLY): Strep Gp B Culture: NEGATIVE

## 2019-02-08 NOTE — Telephone Encounter (Signed)
Preadmission screen  

## 2019-02-12 ENCOUNTER — Encounter (HOSPITAL_COMMUNITY): Payer: Self-pay

## 2019-02-12 ENCOUNTER — Other Ambulatory Visit: Payer: Self-pay

## 2019-02-12 ENCOUNTER — Ambulatory Visit (HOSPITAL_COMMUNITY): Payer: Self-pay | Admitting: *Deleted

## 2019-02-12 ENCOUNTER — Other Ambulatory Visit (HOSPITAL_COMMUNITY): Payer: Self-pay | Admitting: Obstetrics and Gynecology

## 2019-02-12 ENCOUNTER — Telehealth: Payer: Self-pay | Admitting: Family Medicine

## 2019-02-12 ENCOUNTER — Ambulatory Visit (HOSPITAL_COMMUNITY): Payer: Self-pay

## 2019-02-12 ENCOUNTER — Ambulatory Visit (HOSPITAL_COMMUNITY)
Admission: RE | Admit: 2019-02-12 | Discharge: 2019-02-12 | Disposition: A | Discharge status: Home / Self Care | Payer: Self-pay | Source: Ambulatory Visit | Attending: Obstetrics and Gynecology | Admitting: Obstetrics and Gynecology

## 2019-02-12 DIAGNOSIS — O365930 Maternal care for other known or suspected poor fetal growth, third trimester, not applicable or unspecified: Secondary | ICD-10-CM | POA: Insufficient documentation

## 2019-02-12 DIAGNOSIS — O0990 Supervision of high risk pregnancy, unspecified, unspecified trimester: Secondary | ICD-10-CM | POA: Insufficient documentation

## 2019-02-12 DIAGNOSIS — O09293 Supervision of pregnancy with other poor reproductive or obstetric history, third trimester: Secondary | ICD-10-CM

## 2019-02-12 DIAGNOSIS — O26613 Liver and biliary tract disorders in pregnancy, third trimester: Secondary | ICD-10-CM

## 2019-02-12 DIAGNOSIS — K831 Obstruction of bile duct: Secondary | ICD-10-CM

## 2019-02-12 DIAGNOSIS — Z3A36 36 weeks gestation of pregnancy: Secondary | ICD-10-CM

## 2019-02-12 NOTE — Telephone Encounter (Signed)
The patient visited our office stating her husband can not take more time off of work and she would like to cancel the appointment. The patient was offered a virtual visit instead, and she stated it would be much better.

## 2019-02-13 ENCOUNTER — Other Ambulatory Visit: Payer: Self-pay | Admitting: Advanced Practice Midwife

## 2019-02-14 ENCOUNTER — Encounter: Payer: Self-pay | Admitting: Obstetrics & Gynecology

## 2019-02-14 ENCOUNTER — Other Ambulatory Visit: Payer: Self-pay

## 2019-02-14 ENCOUNTER — Ambulatory Visit (INDEPENDENT_AMBULATORY_CARE_PROVIDER_SITE_OTHER): Payer: Self-pay | Admitting: Obstetrics & Gynecology

## 2019-02-14 DIAGNOSIS — K831 Obstruction of bile duct: Secondary | ICD-10-CM

## 2019-02-14 DIAGNOSIS — O0993 Supervision of high risk pregnancy, unspecified, third trimester: Secondary | ICD-10-CM

## 2019-02-14 DIAGNOSIS — Z3A36 36 weeks gestation of pregnancy: Secondary | ICD-10-CM

## 2019-02-14 DIAGNOSIS — O0990 Supervision of high risk pregnancy, unspecified, unspecified trimester: Secondary | ICD-10-CM

## 2019-02-14 DIAGNOSIS — O26613 Liver and biliary tract disorders in pregnancy, third trimester: Secondary | ICD-10-CM

## 2019-02-14 DIAGNOSIS — Z8632 Personal history of gestational diabetes: Secondary | ICD-10-CM

## 2019-02-14 NOTE — Progress Notes (Signed)
I connected with  Colleen Coleman on 02/14/19 at 11:15 AM EDT by telephone and verified that I am speaking with the correct person using two identifiers.   I discussed the limitations, risks, security and privacy concerns of performing an evaluation and management service by telephone and the availability of in person appointments. I also discussed with the patient that there may be a patient responsible charge related to this service. The patient expressed understanding and agreed to proceed.  Colleen Coleman, Colleen Coleman 02/14/2019  10:57 AM

## 2019-02-14 NOTE — Progress Notes (Signed)
TELEHEALTH OBSTETRICS PRENATAL VIRTUAL VIDEO VISIT ENCOUNTER NOTE  Provider location: [facility] [employer] [facility]   I connected with Colleen Coleman on 02/14/19 at 11:15 AM EDT by WebEx Encounter at home and verified that I am speaking with the correct person using two identifiers.   I discussed the limitations, risks, security and privacy concerns of performing an evaluation and management service virtually and the availability of in person appointments. I also discussed with the patient that there may be a patient responsible charge related to this service. The patient expressed understanding and agreed to proceed. Subjective:  Colleen Coleman is a 28 y.o. [phone] at [redacted]w[redacted]d being seen today for ongoing prenatal [place].  She is currently monitored for the following issues for this high-risk pregnancy and has Cholestasis during pregnancy; Supervision of high risk pregnancy, antepartum; History of gestational diabetes; and History of gestational hypertension on their problem list.  Patient reports no complaints.  Contractions: Regular. Vag. Bleeding: None.  Movement: Present. Denies any leaking of fluid.   The following portions of the patient's history were reviewed and updated as appropriate: allergies, current medications, past family history, past medical history, past social history, past surgical history and problem list.   Objective:  There were no vitals filed for this visit.  Fetal Status:     Movement: Present     General:  Alert, oriented and cooperative. Patient is in no acute distress.  Respiratory: Normal respiratory effort, no problems with respiration noted  Mental Status: Normal mood and affect. Normal behavior. Normal judgment and thought content.  Rest of physical exam deferred due to type of encounter  Imaging: [place] Mfm Fetal Bpp W/nonstress  Result Date: 02/05/2019 ----------------------------------------------------------------------   OBSTETRICS REPORT                       (Signed Final 02/05/2019 10:37 am) ---------------------------------------------------------------------- Patient Info  ID #:       [id number]                          D.O.B.:  [birth year] (28 yrs)(F)  Name:       Colleen Boozer-                  Visit Date: 02/05/2019 09:13 am              [place] ---------------------------------------------------------------------- Performed By  Performed By:     Colleen [facility]          Ref. Address:     [address]                    [place]                                                             [address]  Attending:        Noralee Space MD        Location:         [facility]  Referred By:      Colleen [facility] ---------------------------------------------------------------------- Orders   #  Description                          Code         Ordered By   1  [place] MFM FETAL BPP                     98119.1      Colleen [facility]      W/NONSTRESS   2  [place] MFM UA CORD DOPPLER               76820.02     Colleen [facility]  ----------------------------------------------------------------------   #  Order #                    Accession #                 Episode #   1  [id number]                  [id number]                  [id number]   2  [id number]                  [id number]                  [id number]  ---------------------------------------------------------------------- Indications   [redacted] weeks gestation of pregnancy                Z3A.35   Cholestasis of pregnancy, third trimester      F64.332R51.8   (Actigall)   Poor obstetric history: Previous gestational   O09.299   diabetes/GHTN   Maternal [place] for known or suspected poor      O36.5930   fetal growth, third trimester, not applicable or   unspecified  ---------------------------------------------------------------------- Vital Signs                                                 Height:        4'11"  ---------------------------------------------------------------------- Fetal Evaluation  Num Of Fetuses:         1  Fetal Heart Rate(bpm):  144  Cardiac Activity:       Observed  Presentation:           Cephalic  Amniotic Fluid  AFI FV:      Within normal limits  AFI Sum(cm)     %Tile       Largest Pocket(cm)  12.36           39          4.23  RUQ(cm)       RLQ(cm)       LUQ(cm)        LLQ(cm)  2.88          2.07          4.23           3.18 ---------------------------------------------------------------------- Biophysical Evaluation  Amniotic F.V:   Within normal limits       F. Tone:        Observed  F. Movement:    Observed  N.S.T:          Reactive  F. Breathing:   Observed                   Score:          10/10 ---------------------------------------------------------------------- OB History  Gravidity:    4         Term:   3        Prem:   0        SAB:   0  TOP:          0       Ectopic:  0        Living: 3 ---------------------------------------------------------------------- Gestational Age  LMP:           36w 3d        Date:  05/26/18                 EDD:   03/02/19  Best:          Colleen [facility] 3d     Det. By:  Previous Ultrasound      EDD:   03/09/19                                      (10/09/18) ---------------------------------------------------------------------- Anatomy  Thoracic:              Appears normal         Bladder:                Appears normal  Stomach:               Appears normal, left                         sided ---------------------------------------------------------------------- Doppler - Fetal Vessels  Umbilical Artery   S/D     %tile     RI              PI                     ADFV    RDFV  1.85       12   0.46             0.62                         N       N ---------------------------------------------------------------------- Impression  Intrahepatic cholestasis of pregnancy. Fetal growth restriction  Amniotic fluid is normal and good fetal activity is seen.   Antenatal testing is reassuring. NST is reactive. BPP 10/10. ---------------------------------------------------------------------- Recommendations  -BPP, NST and UA Doppler next week.  -Delivery at 37 weeks. ----------------------------------------------------------------------                  Colleen Space, MD Electronically Signed Final Report   02/05/2019 10:37 am ----------------------------------------------------------------------  [place] Mfm Fetal Bpp W/nonstress  Result Date: 01/29/2019 ----------------------------------------------------------------------  OBSTETRICS REPORT                       (Signed Final 01/29/2019 12:30 pm) ---------------------------------------------------------------------- Patient Info  ID #:       [id number]                          D.O.B.:  31-Jan-1991 (28 yrs)(F)  Name:       JOCELIN SCHUELKE-                  Visit Date: 01/29/2019 09:17 am              [place] ---------------------------------------------------------------------- Performed By  Performed By:     Sandi Mealy        Ref. Address:     [address]                    [place]                                                             [address]  Attending:        Noralee Space MD        Location:         [facility]                                                             Fetal [facility]  Referred By:      [facility] ---------------------------------------------------------------------- Orders   #  Description                          Code         Ordered By   1  [place] MFM FETAL BPP                     16109.6      Colleen [facility]      W/NONSTRESS   2  [place] MFM UA CORD DOPPLER               76820.02     Colleen [facility]   3  [place] MFM OB FOLLOW UP                  04540.98     Colleen [facility]  ----------------------------------------------------------------------   #  Order #                    Accession #                 Episode #   1  [id number]                  [id number]                  [id number]   2  [id number]                   [id number]                  [id number]   3  [id number]                  [id number]                  [id number]  ---------------------------------------------------------------------- Indications   Encounter for other antenatal screening  Z36.2   follow-up   Cholestasis of pregnancy, third trimester      Q59.563O75.6   (Actigall)   Poor obstetric history: Previous gestational   O09.299   diabetes/GHTN   Maternal [facility] for known or suspected poor      O36.5930   fetal growth, third trimester, not applicable or   unspecified   [redacted] weeks gestation of pregnancy                Z3A.34  ---------------------------------------------------------------------- Vital Signs  Weight (lb): 171                               Height:        4'11"  BMI:         34.53 ---------------------------------------------------------------------- Fetal Evaluation  Num Of Fetuses:         1  Fetal Heart Rate(bpm):  149  Cardiac Activity:       Observed  Presentation:           Cephalic  Placenta:               Posterior  P. Cord Insertion:      Previously Visualized  Amniotic Fluid  AFI FV:      Within normal limits  AFI Sum(cm)     %Tile       Largest Pocket(cm)  12.96           41          4.61  RUQ(cm)       RLQ(cm)       LUQ(cm)        LLQ(cm)  2.65          2.49          4.61           3.21 ---------------------------------------------------------------------- Biophysical Evaluation  Amniotic F.V:   Within normal limits       F. Tone:        Observed  F. Movement:    Observed                   N.S.T:          Reactive  F. Breathing:   Observed                   Score:          10/10 ---------------------------------------------------------------------- Biometry  BPD:      78.2  mm     G. Age:  31w 3d        < 1  %    CI:        73.11   %    70 - 86                                                          FL/HC:      22.1   %    19.4 - 21.8  HC:      290.7  mm     G. Age:  32w 0d        < 1  %    HC/AC:      1.03        0.96 - 1.11  AC:  282   mm     G. Age:  32w 2d          6  %    FL/BPD:     82.2   %    71 - 87  FL:       64.3  mm     G. Age:  33w 1d         14  %    FL/AC:      22.8   %    20 - 24  HUM:      54.4  mm     G. Age:  31w 4d        < 5  %  LV:        3.9  mm  Est. FW:    1971  gm      4 lb 6 oz      6  % ---------------------------------------------------------------------- OB History  Gravidity:    4         Term:   3        Prem:   0        SAB:   0  TOP:          0       Ectopic:  0        Living: 3 ---------------------------------------------------------------------- Gestational Age  LMP:           35w 3d        Date:  05/26/18                 EDD:   03/02/19  U/S Today:     32w 2d                                        EDD:   03/24/19  Best:          34w 3d     Det. By:  Previous Ultrasound      EDD:   03/09/19                                      (10/09/18) ---------------------------------------------------------------------- Anatomy  Cranium:               Appears normal         Aortic Arch:            Previously seen  Cavum:                 Appears normal         Ductal Arch:            Previously seen  Ventricles:            Appears normal         Diaphragm:              Appears normal  Choroid Plexus:        Previously seen        Stomach:                Appears normal, left  sided  Cerebellum:            Previously seen        Abdomen:                Appears normal  Posterior Fossa:       Previously seen        Abdominal Wall:         Not well visualized  Nuchal Fold:           Not applicable (>20    Cord Vessels:           Previously seen                         wks GA)  Face:                  Not well visualized    Kidneys:                Appear normal  Lips:                  Previously seen        Bladder:                Appears normal  Thoracic:              Appears normal         Spine:                  Previously seen  Heart:                  Previously seen        Upper Extremities:      Previously seen  RVOT:                  Previously seen        Lower Extremities:      Previously seen  LVOT:                  Previously seen  Other:  Heels visualized previously. Nasal bone visualized previously. Right          5th digit visualized previously. ---------------------------------------------------------------------- Doppler - Fetal Vessels  Umbilical Artery   S/D     %tile     RI              PI  2.46       47   0.59             0.85 ---------------------------------------------------------------------- Impression  Intrahepatic cholestasis of pregnancy.  On ultrasound, the estimated fetal weight is at the 6th  percentile (weight gain 404 g over 3 weeks, which is  suboptimal). Head circumference measurement is at between  -2 and -1 SD (normal) and AC measures at less than the 10th  percentile.  Amniotic fluid is normal and good fetal activity is seen.  Umbilical artery Doppler showed normal forward diastolic  flow. NST is reactive. BPP 10/10.  We reassured the patient of the findings. She is aware of the  plan to deliver at 37 weeks. ---------------------------------------------------------------------- Recommendations  -Continue weekly BPP, NST and UA Doppler studies.  -Delivery at 37 weeks. ----------------------------------------------------------------------                  Colleen Space, MD Electronically Signed Final Report   01/29/2019 12:30 pm ----------------------------------------------------------------------  [place] Mfm Fetal Bpp W/nonstress  Result Date: 01/22/2019 ----------------------------------------------------------------------  OBSTETRICS REPORT                       (Signed Final 01/22/2019 01:20 pm) ---------------------------------------------------------------------- Patient Info  ID #:       [id number]                          D.O.B.:  [birth year] (28 yrs)(F)  Name:       Colleen BoozerYURBY DE LEON-                  Visit Date: 01/22/2019 10:55  am              [place] ---------------------------------------------------------------------- Performed By  Performed By:     Eden Lathearrie Stalter BS      Ref. Address:     [address]                    [place]                                                             [address]  Attending:        Noralee Spaceavi Shankar MD        Location:         [facility]                                                             Fetal [facility]  [facility]      [facility] ---------------------------------------------------------------------- Orders   #  Description                          Code         Ordered By   1  US MFM FETAL BPP                     40981.176818.5      [facility]      W/NONSTRESS   2  US MFM UA CORD DOPPLER               [id number]     [facility]  ----------------------------------------------------------------------   #  Order #                    Accession #                 Episode #   1  [id number]                  [phone]                  [id number]   2  [id number]                  [id number]                  [id number]  ---------------------------------------------------------------------- Indications   Encounter for other antenatal screening        Z36.2   follow-up  Cholestasis of pregnancy, third trimester      Z61.096E45.4   (Actigall)   Poor obstetric history: Previous gestational   O09.299   diabetes/GHTN   Maternal [place] for known or suspected poor      O36.5930   fetal growth, third trimester, not applicable or   unspecified   [redacted] weeks gestation of pregnancy                Z3A.33  ---------------------------------------------------------------------- Vital Signs                                                 Height:        4'11" ---------------------------------------------------------------------- Fetal Evaluation  Num Of Fetuses:         1  Fetal Heart Rate(bpm):  155  Cardiac Activity:       Observed  Presentation:           Breech  Amniotic Fluid  AFI FV:      Within normal limits  AFI Sum(cm)      %Tile       Largest Pocket(cm)  12.78           39          4.1  RUQ(cm)       RLQ(cm)       LUQ(cm)        LLQ(cm)  3.87          4.1           3.35           1.46 ---------------------------------------------------------------------- Biophysical Evaluation  Amniotic F.V:   Within normal limits       F. Tone:        Observed  F. Movement:    Observed                   N.S.T:          Reactive  F. Breathing:   Observed                   Score:          10/10 ---------------------------------------------------------------------- OB History  Gravidity:    4         Term:   3        Prem:   0        SAB:   0  TOP:          0       Ectopic:  0        Living: 3 ---------------------------------------------------------------------- Gestational Age  LMP:           34w 3d        Date:  05/26/18                 EDD:   03/02/19  Best:          33w 3d     Det. By:  Previous Ultrasound      EDD:   03/09/19                                      (10/09/18) ---------------------------------------------------------------------- Doppler - Fetal Vessels  Umbilical Artery   S/D     %tile  RI              PI                     ADFV    RDFV  2.43       41   0.59             0.85                        No      No ---------------------------------------------------------------------- Impression  Intrahepatic cholestasis of pregnancy.  Fetal growth restriction.  Amniotic fluid is normal and good fetal activity is seen.  Umbilical artery Doppler study is normal. NST is reactive.  BPP 10/10.  Patient had questions about fetal weight. I counseled her with  help of Spanish language interpreter about the possibility of  growth restriction vs. constitutionally-small fetus. ---------------------------------------------------------------------- Recommendations  -Continue weekly BPP, NST and UA Doppler studies.  -Fetal growth next week.  -Delivery at 37 weeks. ----------------------------------------------------------------------                   Colleen Space, MD Electronically Signed Final Report   01/22/2019 01:20 pm ----------------------------------------------------------------------  [place] Mfm Fetal Bpp Wo Non Stress  Result Date: 02/13/2019 ----------------------------------------------------------------------  OBSTETRICS REPORT                       (Signed Final 02/13/2019 10:14 am) ---------------------------------------------------------------------- Patient Info  ID #:       [id number]                          D.O.B.:  [birth year] (28 yrs)(F)  Name:       Colleen Boozer-                  Visit Date: 02/12/2019 08:10 am              [place] ---------------------------------------------------------------------- Performed By  Performed By:     Eden Lathe BS      Ref. Address:     [address]                    [place]                                                             [address]  Attending:        Lin Landsman      Location:         [facility]                    MD                                       Fetal [facility]  Referred By:      [facility] ---------------------------------------------------------------------- Orders   #  Description                          Code         Ordered By   1  [place]  MFM UA CORD DOPPLER               76820.02     Colleen [facility]   2  [place] MFM FETAL BPP WO NON              76819.01     Colleen [facility]      STRESS  ----------------------------------------------------------------------   #  Order #                    Accession #                 Episode #   1  [id number]                  [id number]                  [id number]   2  [id number]                  [id number]                  [id number]  ---------------------------------------------------------------------- Indications   Encounter for other antenatal screening        Z36.2   follow-up   Cholestasis of pregnancy, third trimester      E33.295J88.4   (Actigall)   Maternal [place] for known or suspected poor      O36.5930   fetal growth, third trimester, not  applicable or   unspecified   Poor obstetric history: Previous gestational   O38.299   diabetes/GHTN   [redacted] weeks gestation of pregnancy                Z3A.36  ---------------------------------------------------------------------- Vital Signs                                                 Height:        4'11" ---------------------------------------------------------------------- Fetal Evaluation  Num Of Fetuses:         1  Fetal Heart Rate(bpm):  153  Cardiac Activity:       Observed  Presentation:           Cephalic  Placenta:               Posterior  P. Cord Insertion:      Previously Visualized  Amniotic Fluid  AFI FV:      Within normal limits  AFI Sum(cm)     %Tile       Largest Pocket(cm)  6.73            3           3  RUQ(cm)                     LUQ(cm)        LLQ(cm)  3                           2.52           1.21 ---------------------------------------------------------------------- Biophysical Evaluation  Amniotic F.V:   Within normal limits       F. Tone:        Observed  F. Movement:    Observed  Score:          8/8  F. Breathing:   Observed ---------------------------------------------------------------------- OB History  Gravidity:    4         Term:   3        Prem:   0        SAB:   0  TOP:          0       Ectopic:  0        Living: 3 ---------------------------------------------------------------------- Gestational Age  LMP:           37w 3d        Date:  05/26/18                 EDD:   03/02/19  Best:          Stevie Kern 3d     Det. By:  Previous Ultrasound      EDD:   03/09/19                                      (10/09/18) ---------------------------------------------------------------------- Doppler - Fetal Vessels  Umbilical Artery   S/D     %tile     RI              PI                     ADFV    RDFV  2.52       59     0.6            0.91                        No      No ---------------------------------------------------------------------- Impression  Known IUGR  Biophysical  profile 8/8  UA Dopplers within normal limits ---------------------------------------------------------------------- Recommendations  Continue weeky testing with UA Dopplers ----------------------------------------------------------------------               Lin Landsman, MD Electronically Signed Final Report   02/13/2019 10:14 am ----------------------------------------------------------------------  [place] Mfm Fetal Bpp Wo Non Stress  Result Date: 01/15/2019 ----------------------------------------------------------------------  OBSTETRICS REPORT                       (Signed Final 01/15/2019 11:10 am) ---------------------------------------------------------------------- Patient Info  ID #:       [id number]                          D.O.B.:  08-Dec-1990 (28 yrs)(F)  Name:       Colleen Boozer-                  Visit Date: 01/15/2019 10:51 am              [place] ---------------------------------------------------------------------- Performed By  Performed By:     Eden Lathe BS      Ref. Address:     [address]                    [address] [address]  [address]  Attending:        Noralee Space MD        Location:         [facility]                                                             Fetal [facility]  Referred By:      [facility] ---------------------------------------------------------------------- Orders   #  Description                          Code         Ordered By   1  [place] MFM FETAL BPP WO NON              76819.01     Colleen [facility]      STRESS   2  [place] MFM UA CORD DOPPLER               76820.02     Colleen [facility]  ----------------------------------------------------------------------   #  Order #                    Accession #                 Episode #   1  [id number]                  [id number]                  [id number]   2  [id number]                  [id number]                  [id number]   ---------------------------------------------------------------------- Indications   Encounter for other antenatal screening        Z36.2   follow-up   Cholestasis of pregnancy, third trimester      Q59.563O75.6   Gestational hypertension without significant   O13.3   proteinuria, third trimester   Poor obstetric history: Previous gestational   O09.299   diabetes   Maternal [facility] for known or suspected poor      O36.5930   fetal growth, third trimester, not applicable or   unspecified   [redacted] weeks gestation of pregnancy                Z3A.32  ---------------------------------------------------------------------- Vital Signs                                                 Height:        4'11" ---------------------------------------------------------------------- Fetal Evaluation  Num Of Fetuses:         1  Fetal Heart Rate(bpm):  150  Cardiac Activity:       Observed  Presentation:           Cephalic  Amniotic Fluid  AFI FV:      Within normal limits  AFI Sum(cm)     %Tile       Largest Pocket(cm)  13.56  44          5.17  RUQ(cm)       RLQ(cm)       LUQ(cm)        LLQ(cm)  5.17          2.74          3.85           1.8 ---------------------------------------------------------------------- Biophysical Evaluation  Amniotic F.V:   Within normal limits       F. Tone:        Observed  F. Movement:    Observed                   Score:          8/8  F. Breathing:   Observed ---------------------------------------------------------------------- OB History  Gravidity:    4         Term:   3        Prem:   0        SAB:   0  TOP:          0       Ectopic:  0        Living: 3 ---------------------------------------------------------------------- Gestational Age  LMP:           33w 3d        Date:  05/26/18                 EDD:   03/02/19  Best:          Armida Sans 3d     Det. By:  Previous Ultrasound      EDD:   03/09/19                                      (10/09/18)  ---------------------------------------------------------------------- Doppler - Fetal Vessels  Umbilical Artery   S/D     %tile                                            ADFV    RDFV   2.4       34                                                No      No ---------------------------------------------------------------------- Impression  Fetal growth restriction.  Amniotic fluid is normal and good fetal activity is seen.  Umbilical artery Doppler showed normal forward diastolic  flow. Antenatal testing is reassuring. BPP 8/8. ---------------------------------------------------------------------- Recommendations  -Continue weekly BPP and Doppler till delivery. ----------------------------------------------------------------------                  Colleen Space, MD Electronically Signed Final Report   01/15/2019 11:10 am ----------------------------------------------------------------------  [place] Mfm Ob Follow Up  Result Date: 01/29/2019 ----------------------------------------------------------------------  OBSTETRICS REPORT                       (Signed Final 01/29/2019 12:30 pm) ---------------------------------------------------------------------- Patient Info  ID #:       [id number]  D.O.B.:  Dec 06, 1990 (28 yrs)(F)  Name:       Colleen BoozerYURBY DE LEON-                  Visit Date: 01/29/2019 09:17 am              [facility] ---------------------------------------------------------------------- Performed By  Performed By:     Sandi MealyJovancia Adrien        Ref. Address:     [address]                    [place]                                                             [address]  Attending:        Noralee Spaceavi Shankar MD        Location:         [facility]                                                             [facility]  Referred By:      [facility] ---------------------------------------------------------------------- Orders   #  Description                          Code         Ordered By   1  US MFM  FETAL BPP                     42595.676818.5      Colleen [facility]      W/NONSTRESS   2  US MFM UA CORD DOPPLER               76820.02     Colleen [facility]   3  US MFM OB FOLLOW UP                  38756.4376816.01     [facility]  ----------------------------------------------------------------------   #  Order #                    Accession #                 Episode #   1  [id number]                  [id number]   2  [id number]                  [id number]   3  [id number]                  [id number]  ---------------------------------------------------------------------- Indications   Encounter for other antenatal screening  Z36.2   follow-up   Cholestasis of pregnancy, third trimester      Z61.096E45.4   (Actigall)   Poor obstetric history: Previous gestational   O09.299   diabetes/GHTN   Maternal [facility] for known or suspected poor      O36.5930   fetal growth, third trimester, not applicable or   unspecified   [redacted] weeks gestation of pregnancy                Z3A.34  ---------------------------------------------------------------------- Vital Signs  Weight (lb): 171                               Height:        4'11"  BMI:         34.53 ---------------------------------------------------------------------- Fetal Evaluation  Num Of Fetuses:         1  Fetal Heart Rate(bpm):  149  Cardiac Activity:       Observed  Presentation:           Cephalic  Placenta:               Posterior  P. Cord Insertion:      Previously Visualized  Amniotic Fluid  AFI FV:      Within normal limits  AFI Sum(cm)     %Tile       Largest Pocket(cm)  12.96           41          4.61  RUQ(cm)       RLQ(cm)       LUQ(cm)        LLQ(cm)  2.65          2.49          4.61           3.21 ---------------------------------------------------------------------- Biophysical Evaluation  Amniotic F.V:   Within normal limits       F. Tone:        Observed  F. Movement:    Observed                    N.S.T:          Reactive  F. Breathing:   Observed                   Score:          10/10 ---------------------------------------------------------------------- Biometry  BPD:      78.2  mm     G. Age:  31w 3d        < 1  %    CI:        73.11   %    70 - 86                                                          FL/HC:      22.1   %    19.4 - 21.8  HC:      290.7  mm     G. Age:  32w 0d        < 1  %    HC/AC:      1.03        0.96 - 1.11  AC:       282   mm     G. Age:  32w 2d          6  %    FL/BPD:     82.2   %    71 - 87  FL:       64.3  mm     G. Age:  33w 1d         14  %    FL/AC:      22.8   %    20 - 24  HUM:      54.4  mm     G. Age:  31w 4d        < 5  %  LV:        3.9  mm  Est. FW:    1971  gm      4 lb 6 oz      6  % ---------------------------------------------------------------------- OB History  Gravidity:    4         Term:   3        Prem:   0        SAB:   0  TOP:          0       Ectopic:  0        Living: 3 ---------------------------------------------------------------------- Gestational Age  LMP:           35w 3d        Date:  05/26/18                 EDD:   03/02/19  U/S Today:     32w 2d                                        EDD:   03/24/19  Best:          34w 3d     Det. By:  Previous Ultrasound      EDD:   03/09/19                                      (10/09/18) ---------------------------------------------------------------------- Anatomy  Cranium:               Appears normal         Aortic Arch:            Previously seen  Cavum:                 Appears normal         Ductal Arch:            Previously seen  Ventricles:            Appears normal         Diaphragm:              Appears normal  Choroid Plexus:        Previously seen        Stomach:                Appears normal, left  sided  Cerebellum:            Previously seen        Abdomen:                Appears normal  Posterior Fossa:       Previously seen         Abdominal Wall:         Not well visualized  Nuchal Fold:           Not applicable (>20    Cord Vessels:           Previously seen                         wks GA)  Face:                  Not well visualized    Kidneys:                Appear normal  Lips:                  Previously seen        Bladder:                Appears normal  Thoracic:              Appears normal         Spine:                  Previously seen  Heart:                 Previously seen        Upper Extremities:      Previously seen  RVOT:                  Previously seen        Lower Extremities:      Previously seen  LVOT:                  Previously seen  Other:  Heels visualized previously. Nasal bone visualized previously. Right          5th digit visualized previously. ---------------------------------------------------------------------- Doppler - Fetal Vessels  Umbilical Artery   S/D     %tile     RI              PI  2.46       47   0.59             0.85 ---------------------------------------------------------------------- Impression  Intrahepatic cholestasis of pregnancy.  On ultrasound, the estimated fetal weight is at the 6th  percentile (weight gain 404 g over 3 weeks, which is  suboptimal). Head circumference measurement is at between  -2 and -1 SD (normal) and AC measures at less than the 10th  percentile.  Amniotic fluid is normal and good fetal activity is seen.  Umbilical artery Doppler showed normal forward diastolic  flow. NST is reactive. BPP 10/10.  We reassured the patient of the findings. She is aware of the  plan to deliver at 37 weeks. ---------------------------------------------------------------------- Recommendations  -Continue weekly BPP, NST and UA Doppler studies.  -Delivery at 37 weeks. ----------------------------------------------------------------------                  Colleen Space, MD Electronically Signed Final Report   01/29/2019 12:30 pm  ----------------------------------------------------------------------  [place] Mfm Ua Cord Doppler  Result Date:  02/13/2019 ----------------------------------------------------------------------  OBSTETRICS REPORT                       (Signed Final 02/13/2019 10:14 am) ---------------------------------------------------------------------- Patient Info  ID #:       [id number]                          D.O.B.:  04-13-1991 (28 yrs)(F)  Name:       Colleen Boozer-                  Visit Date: 02/12/2019 08:10 am              [facility] ---------------------------------------------------------------------- Performed By  Performed By:     Eden Lathe BS      Ref. Address:     [address]                    [place]                                                             [address]  Attending:        Lin Landsman      Location:         [facility]                    MD                                       Fetal [facility]  Referred By:      [facility] ---------------------------------------------------------------------- Orders   #  Description                          Code         Ordered By   1  [place] MFM UA CORD DOPPLER               76820.02     Colleen [facility]   2  [place] MFM FETAL BPP WO NON              76819.01     Colleen [facility]      STRESS  ----------------------------------------------------------------------   #  Order #                    Accession #                 Episode #   1  [id number]                  [id number]                  [id number]   2  [id number]                  [id number]                  [id number]  ---------------------------------------------------------------------- Indications   Encounter for other antenatal screening        Z36.2   follow-up   Cholestasis of pregnancy, third trimester  Z61.096E45.4   ([place]igall)   Maternal [facility] for known or suspected poor      O36.5930   fetal growth, third trimester, not applicable or   unspecified   Poor obstetric history: Previous gestational   O69.299    diabetes/GHTN   [redacted] weeks gestation of pregnancy                Z3A.36  ---------------------------------------------------------------------- Vital Signs                                                 Height:        4'11" ---------------------------------------------------------------------- Fetal Evaluation  Num Of Fetuses:         1  Fetal Heart Rate(bpm):  153  Cardiac Activity:       Observed  Presentation:           Cephalic  Placenta:               Posterior  P. Cord Insertion:      Previously Visualized  Amniotic Fluid  AFI FV:      Within normal limits  AFI Sum(cm)     %Tile       Largest Pocket(cm)  6.73            3           3  RUQ(cm)                     LUQ(cm)        LLQ(cm)  3                           2.52           1.21 ---------------------------------------------------------------------- Biophysical Evaluation  Amniotic F.V:   Within normal limits       F. Tone:        Observed  F. Movement:    Observed                   Score:          8/8  F. Breathing:   Observed ---------------------------------------------------------------------- OB History  Gravidity:    4         Term:   3        Prem:   0        SAB:   0  TOP:          0       Ectopic:  0        Living: 3 ---------------------------------------------------------------------- Gestational Age  LMP:           37w 3d        Date:  05/26/18                 EDD:   03/02/19  Best:          Stevie Kern 3d     Det. By:  Previous Ultrasound      EDD:   03/09/19                                      (10/09/18) ---------------------------------------------------------------------- Doppler - Fetal Vessels  Umbilical Artery   S/D     %  tile     RI              PI                     ADFV    RDFV  2.52       59     0.6            0.91                        No      No ---------------------------------------------------------------------- Impression  Known IUGR  Biophysical profile 8/8  UA Dopplers within normal limits  ---------------------------------------------------------------------- Recommendations  Continue weeky testing with UA Dopplers ----------------------------------------------------------------------               Lin [facility]man, MD Electronically Signed Final Report   02/13/2019 10:14 am ----------------------------------------------------------------------  [place] Mfm Ua Cord Doppler  Result Date: 02/05/2019 ----------------------------------------------------------------------  OBSTETRICS REPORT                       (Signed Final 02/05/2019 10:37 am) ---------------------------------------------------------------------- Patient Info  ID #:       [id number]                          D.O.B.:  1991-03-23 (28 yrs)(F)  Name:       Colleen Boozer-                  Visit Date: 02/05/2019 09:13 am              [facility] ---------------------------------------------------------------------- Performed By  Performed By:     Colleen [facility]          Ref. Address:     [address]                    [place]                                                             [address]  Attending:        Noralee Space MD        Location:         [facility]                                                             [facility]  Referred By:      [facility] ---------------------------------------------------------------------- Orders   #  Description                          Code         Ordered By   1  [place] MFM FETAL BPP                     40981.1      Colleen [facility]      W/NONSTRESS   2  [place] MFM UA CORD DOPPLER  16109.60     Colleen [facility]  ----------------------------------------------------------------------   #  Order #                    Accession #                 Episode #   1  [id number]                  [id number]                  [id number]   2  [id number]                  [id number]                  [id number]  ---------------------------------------------------------------------- Indications   [redacted] weeks gestation of pregnancy                 Z3A.35   Cholestasis of pregnancy, third trimester      V42.595G38.7   (Actigall)   Poor obstetric history: Previous gestational   O09.299   diabetes/GHTN   Maternal [facility] for known or suspected poor      O36.5930   fetal growth, third trimester, not applicable or   unspecified  ---------------------------------------------------------------------- Vital Signs                                                 Height:        4'11" ---------------------------------------------------------------------- Fetal Evaluation  Num Of Fetuses:         1  Fetal Heart Rate(bpm):  144  Cardiac Activity:       Observed  Presentation:           Cephalic  Amniotic Fluid  AFI FV:      Within normal limits  AFI Sum(cm)     %Tile       Largest Pocket(cm)  12.36           39          4.23  RUQ(cm)       RLQ(cm)       LUQ(cm)        LLQ(cm)  2.88          2.07          4.23           3.18 ---------------------------------------------------------------------- Biophysical Evaluation  Amniotic F.V:   Within normal limits       F. Tone:        Observed  F. Movement:    Observed                   N.S.T:          Reactive  F. Breathing:   Observed                   Score:          10/10 ---------------------------------------------------------------------- OB History  Gravidity:    4         Term:   3        Prem:   0        SAB:   0  TOP:          0       Ectopic:  0  Living: 3 ---------------------------------------------------------------------- Gestational Age  LMP:           36w 3d        Date:  05/26/18                 EDD:   03/02/19  Best:          Colleen [facility] 3d     Det. By:  Previous Ultrasound      EDD:   03/09/19                                      (10/09/18) ---------------------------------------------------------------------- Anatomy  Thoracic:              Appears normal         Bladder:                Appears normal  Stomach:               Appears normal, left                         sided  ---------------------------------------------------------------------- Doppler - Fetal Vessels  Umbilical Artery   S/D     %tile     RI              PI                     ADFV    RDFV  1.85       12   0.46             0.62                         N       N ---------------------------------------------------------------------- Impression  Intrahepatic cholestasis of pregnancy. Fetal growth restriction  Amniotic fluid is normal and good fetal activity is seen.  Antenatal testing is reassuring. NST is reactive. BPP 10/10. ---------------------------------------------------------------------- Recommendations  -BPP, NST and UA Doppler next week.  -Delivery at 37 weeks. ----------------------------------------------------------------------                  Colleen Space, MD Electronically Signed Final Report   02/05/2019 10:37 am ----------------------------------------------------------------------  [place] Mfm Ua Cord Doppler  Result Date: 01/29/2019 ----------------------------------------------------------------------  OBSTETRICS REPORT                       (Signed Final 01/29/2019 12:30 pm) ---------------------------------------------------------------------- Patient Info  ID #:       [id number]                          D.O.B.:  12/23/1990 (28 yrs)(F)  Name:       Colleen Boozer-                  Visit Date: 01/29/2019 09:17 am              [facility] ---------------------------------------------------------------------- Performed By  Performed By:     Sandi Mealy        Ref. Address:     [address]                    [place]  [address]  Attending:        Noralee Space MD        Location:         [facility]                                                             [facility]  Referred By:      [facility] ---------------------------------------------------------------------- Orders   #  Description                          Code         Ordered By   1  [place]  MFM FETAL BPP                     69629.5      Colleen [facility]      W/NONSTRESS   2  [place] MFM UA CORD DOPPLER               76820.02     Colleen [facility]   3  [place] MFM OB FOLLOW UP                  76816.01     Colleen [facility]  ----------------------------------------------------------------------   #  Order #                    Accession #                 Episode #   1  [id number]                  [id number]                  [id number]   2  [id number]                  [id number]                  [id number]   3  [id number]                  [id number]                  [id number]  ---------------------------------------------------------------------- Indications   Encounter for other antenatal screening        Z36.2   follow-up   Cholestasis of pregnancy, third trimester      V76.160V37.1   (Actigall)   Poor obstetric history: Previous gestational   O09.299   diabetes/GHTN   Maternal [facility] for known or suspected poor      O36.5930   fetal growth, third trimester, not applicable or   unspecified   [redacted] weeks gestation of pregnancy                Z3A.34  ---------------------------------------------------------------------- Vital Signs  Weight (lb): 171                               Height:        4'11"  BMI:         34.53 ---------------------------------------------------------------------- Fetal Evaluation  Num Of Fetuses:         1  Fetal  Heart Rate(bpm):  149  Cardiac Activity:       Observed  Presentation:           Cephalic  Placenta:               Posterior  P. Cord Insertion:      Previously Visualized  Amniotic Fluid  AFI FV:      Within normal limits  AFI Sum(cm)     %Tile       Largest Pocket(cm)  12.96           41          4.61  RUQ(cm)       RLQ(cm)       LUQ(cm)        LLQ(cm)  2.65          2.49          4.61           3.21 ---------------------------------------------------------------------- Biophysical Evaluation  Amniotic F.V:   Within normal limits       F. Tone:        Observed  F. Movement:    Observed                    N.S.T:          Reactive  F. Breathing:   Observed                   Score:          10/10 ---------------------------------------------------------------------- Biometry  BPD:      78.2  mm     G. Age:  31w 3d        < 1  %    CI:        73.11   %    70 - 86                                                          FL/HC:      22.1   %    19.4 - 21.8  HC:      290.7  mm     G. Age:  32w 0d        < 1  %    HC/AC:      1.03        0.96 - 1.11  AC:       282   mm     G. Age:  32w 2d          6  %    FL/BPD:     82.2   %    71 - 87  FL:       64.3  mm     G. Age:  33w 1d         14  %    FL/AC:      22.8   %    20 - 24  HUM:      54.4  mm     G. Age:  31w 4d        < 5  %  LV:        3.9  mm  Est. FW:    1971  gm      4 lb 6  oz      6  % ---------------------------------------------------------------------- OB History  Gravidity:    4         Term:   3        Prem:   0        SAB:   0  TOP:          0       Ectopic:  0        Living: 3 ---------------------------------------------------------------------- Gestational Age  LMP:           35w 3d        Date:  05/26/18                 EDD:   03/02/19  U/S Today:     32w 2d                                        EDD:   03/24/19  Best:          34w 3d     Det. By:  Previous Ultrasound      EDD:   03/09/19                                      (10/09/18) ---------------------------------------------------------------------- Anatomy  Cranium:               Appears normal         Aortic Arch:            Previously seen  Cavum:                 Appears normal         Ductal Arch:            Previously seen  Ventricles:            Appears normal         Diaphragm:              Appears normal  Choroid Plexus:        Previously seen        Stomach:                Appears normal, left                                                                        sided  Cerebellum:            Previously seen        Abdomen:                Appears normal  Posterior Fossa:       Previously  seen        Abdominal Wall:         Not well visualized  Nuchal Fold:           Not applicable (>20    Cord Vessels:           Previously seen  wks GA)  Face:                  Not well visualized    Kidneys:                Appear normal  Lips:                  Previously seen        Bladder:                Appears normal  Thoracic:              Appears normal         Spine:                  Previously seen  Heart:                 Previously seen        Upper Extremities:      Previously seen  RVOT:                  Previously seen        Lower Extremities:      Previously seen  LVOT:                  Previously seen  Other:  Heels visualized previously. Nasal bone visualized previously. Right          5th digit visualized previously. ---------------------------------------------------------------------- Doppler - Fetal Vessels  Umbilical Artery   S/D     %tile     RI              PI  2.46       47   0.59             0.85 ---------------------------------------------------------------------- Impression  Intrahepatic cholestasis of pregnancy.  On ultrasound, the estimated fetal weight is at the 6th  percentile (weight gain 404 g over 3 weeks, which is  suboptimal). Head circumference measurement is at between  -2 and -1 SD (normal) and AC measures at less than the 10th  percentile.  Amniotic fluid is normal and good fetal activity is seen.  Umbilical artery Doppler showed normal forward diastolic  flow. NST is reactive. BPP 10/10.  We reassured the patient of the findings. She is aware of the  plan to deliver at 37 weeks. ---------------------------------------------------------------------- Recommendations  -Continue weekly BPP, NST and UA Doppler studies.  -Delivery at 37 weeks. ----------------------------------------------------------------------                  Colleen Space, MD Electronically Signed Final Report   01/29/2019 12:30 pm  ----------------------------------------------------------------------  [place] Mfm Ua Cord Doppler  Result Date: 01/22/2019 ----------------------------------------------------------------------  OBSTETRICS REPORT                       (Signed Final 01/22/2019 01:20 pm) ---------------------------------------------------------------------- Patient Info  ID #:       [id number]                          D.O.B.:  01-13-1991 (28 yrs)(F)  Name:       Colleen Boozer-                  Visit Date: 01/22/2019 10:55 am              [facility] ---------------------------------------------------------------------- Performed By  Performed By:  Carrie Stalter BS      Ref. Address:     [address]                    [place]                                                             [address]  Attending:        Noralee Space MD        Location:         [facility]                                                             [facility]  Referred By:      [facility] ---------------------------------------------------------------------- Orders   #  Description                          Code         Ordered By   1  [place] MFM FETAL BPP                     [phone]      Colleen [facility]      W/NONSTRESS   2  [place] MFM UA CORD DOPPLER               [id number]     Colleen [facility]  ----------------------------------------------------------------------   #  Order #                    Accession #                 Episode #   1  [id number]                  [id number]                  [id number]   2  [id number]                  [id number]                  [id number]  ---------------------------------------------------------------------- Indications   Encounter for other antenatal screening        Z36.2   follow-up   Cholestasis of pregnancy, third trimester      K74.259D63.8   ([place])   Poor obstetric history: Previous gestational   O09.299   diabetes/GHTN   Maternal [facility] for known or suspected poor      O36.5930   fetal growth, third trimester, not applicable  or   unspecified   [redacted] weeks gestation of pregnancy                Z3A.33  ---------------------------------------------------------------------- Vital Signs  Height:        4'11" ---------------------------------------------------------------------- Fetal Evaluation  Num Of Fetuses:         1  Fetal Heart Rate(bpm):  155  Cardiac Activity:       Observed  Presentation:           Breech  Amniotic Fluid  AFI FV:      Within normal limits  AFI Sum(cm)     %Tile       Largest Pocket(cm)  12.78           39          4.1  RUQ(cm)       RLQ(cm)       LUQ(cm)        LLQ(cm)  3.87          4.1           3.35           1.46 ---------------------------------------------------------------------- Biophysical Evaluation  Amniotic F.V:   Within normal limits       F. Tone:        Observed  F. Movement:    Observed                   N.S.T:          Reactive  F. Breathing:   Observed                   Score:          10/10 ---------------------------------------------------------------------- OB History  Gravidity:    4         Term:   3        Prem:   0        SAB:   0  TOP:          0       Ectopic:  0        Living: 3 ---------------------------------------------------------------------- Gestational Age  LMP:           34w 3d        Date:  05/26/18                 EDD:   03/02/19  Best:          33w 3d     Det. By:  Previous Ultrasound      EDD:   03/09/19                                      (10/09/18) ---------------------------------------------------------------------- Doppler - Fetal Vessels  Umbilical Artery   S/D     %tile     RI              PI                     ADFV    RDFV  2.43       41   0.59             0.85                        No      No ---------------------------------------------------------------------- Impression  Intrahepatic cholestasis of pregnancy.  Fetal growth restriction.  Amniotic fluid is normal and good fetal activity is seen.  Umbilical artery  Doppler study is normal. NST is reactive.  BPP 10/10.  Patient had questions about fetal weight. I counseled her with  help of Spanish language interpreter about the possibility of  growth restriction vs. constitutionally-small fetus. ---------------------------------------------------------------------- Recommendations  -Continue weekly BPP, NST and UA Doppler studies.  -Fetal growth next week.  -Delivery at 37 weeks. ----------------------------------------------------------------------                  Colleen Space, MD Electronically Signed Final Report   01/22/2019 01:20 pm ----------------------------------------------------------------------  [place] Mfm Ua Cord Doppler  Result Date: 01/15/2019 ----------------------------------------------------------------------  OBSTETRICS REPORT                       (Signed Final 01/15/2019 11:10 am) ---------------------------------------------------------------------- Patient Info  ID #:       [id number]                          D.O.B.:  11/05/1990 (28 yrs)(F)  Name:       Colleen Boozer-                  Visit Date: 01/15/2019 10:51 am              [facility] ---------------------------------------------------------------------- Performed By  Performed By:     Eden Lathe BS      Ref. Address:     [address]                    [place]                                                             [address]  Attending:        Noralee Space MD        Location:         [facility]                                                             [facility]  Referred By:      [facility] ---------------------------------------------------------------------- Orders   #  Description                          Code         Ordered By   1  [place] MFM FETAL BPP WO NON              76819.01     Colleen [facility]      STRESS   2  [place] MFM UA CORD DOPPLER               91478.29     Colleen [facility]  ----------------------------------------------------------------------   #  Order #                     Accession #                 Episode #   1  [id number]  [id number]                  [id number]   2  [id number]                  [id number]                  [id number]  ---------------------------------------------------------------------- Indications   Encounter for other antenatal screening        Z36.2   follow-up   Cholestasis of pregnancy, third trimester      O26.613K83.1   Gestational hypertension without significant   O13.3   proteinuria, third trimester   Poor obstetric history: Previous gestational   O09.299   diabetes   Maternal [facility] for known or suspected poor      O36.5930   fetal growth, third trimester, not applicable or   unspecified   [redacted] weeks gestation of pregnancy                Z3A.32  ---------------------------------------------------------------------- Vital Signs                                                 Height:        4'11" ---------------------------------------------------------------------- Fetal Evaluation  Num Of Fetuses:         1  Fetal Heart Rate(bpm):  150  Cardiac Activity:       Observed  Presentation:           Cephalic  Amniotic Fluid  AFI FV:      Within normal limits  AFI Sum(cm)     %Tile       Largest Pocket(cm)  13.56           44          5.17  RUQ(cm)       RLQ(cm)       LUQ(cm)        LLQ(cm)  5.17          2.74          3.85           1.8 ---------------------------------------------------------------------- Biophysical Evaluation  Amniotic F.V:   Within normal limits       F. Tone:        Observed  F. Movement:    Observed                   Score:          8/8  F. Breathing:   Observed ---------------------------------------------------------------------- OB History  Gravidity:    4         Term:   3        Prem:   0        SAB:   0  TOP:          0       Ectopic:  0        Living: 3 ---------------------------------------------------------------------- Gestational Age  LMP:           33w 3d        Date:  05/26/18                 EDD:   03/02/19  Best:           Armida Sans 3d     Det. By:  Previous Ultrasound  EDD:   03/09/19                                      (10/09/18) ---------------------------------------------------------------------- Doppler - Fetal Vessels  Umbilical Artery   S/D     %tile                                            ADFV    RDFV   2.4       34                                                No      No ---------------------------------------------------------------------- Impression  Fetal growth restriction.  Amniotic fluid is normal and good fetal activity is seen.  Umbilical artery Doppler showed normal forward diastolic  flow. Antenatal testing is reassuring. BPP 8/8. ---------------------------------------------------------------------- Recommendations  -Continue weekly BPP and Doppler till delivery. ----------------------------------------------------------------------                  Colleen Space, MD Electronically Signed Final Report   01/15/2019 11:10 am ----------------------------------------------------------------------   Assessment and Plan:  Pregnancy: [id number] at [redacted]w[redacted]d 1. Cholestasis during pregnancy, antepartum - IOL scheduled  2. Supervision of high risk pregnancy, antepartum   3. History of gestational diabetes - had a normal 2 hour gtt  4. Language barrier- interpretor used for visit  Term labor symptoms and general obstetric precautions including but not limited to vaginal bleeding, contractions, leaking of fluid and fetal movement were reviewed in detail with the patient. I discussed the assessment and treatment plan with the patient. The patient was provided an opportunity to ask questions and all were answered. The patient agreed with the plan and demonstrated an understanding of the instructions. The patient was advised to call back or seek an in-person office evaluation/go to MAU at [facility] for any urgent or concerning symptoms. Please refer to After Visit Summary for other  counseling recommendations.   I provided 10 minutes of face-to-face time during this encounter.  Return in about 5 weeks (around 03/21/2019) for pp exam.  Future Appointments  Date Time Provider Department [facility]  02/14/2019 11:15 AM Allie Bossier, MD WOC-WOCA WOC  02/16/2019  8:40 AM MC-MAU 1 MC-INDC None  02/18/2019  7:30 AM MC-LD SCHED ROOM MC-INDC None    Allie Bossier, MD [facility] [employer], [facility]

## 2019-02-16 ENCOUNTER — Other Ambulatory Visit (HOSPITAL_COMMUNITY)
Admission: RE | Admit: 2019-02-16 | Discharge: 2019-02-16 | Disposition: A | Discharge status: Home / Self Care | Payer: HRSA Program | Source: Ambulatory Visit | Attending: Obstetrics & Gynecology | Admitting: Obstetrics & Gynecology

## 2019-02-16 ENCOUNTER — Other Ambulatory Visit: Payer: Self-pay

## 2019-02-16 DIAGNOSIS — Z20828 Contact with and (suspected) exposure to other viral communicable diseases: Secondary | ICD-10-CM | POA: Insufficient documentation

## 2019-02-16 DIAGNOSIS — Z01812 Encounter for preprocedural laboratory examination: Secondary | ICD-10-CM | POA: Diagnosis not present

## 2019-02-16 LAB — SARS CORONAVIRUS 2 (TAT 6-24 HRS): SARS Coronavirus 2: NEGATIVE

## 2019-02-16 NOTE — MAU Note (Signed)
Asymptomatic, swab collected. 

## 2019-02-18 ENCOUNTER — Inpatient Hospital Stay (HOSPITAL_COMMUNITY): Payer: Medicaid Other

## 2019-02-18 ENCOUNTER — Inpatient Hospital Stay (HOSPITAL_COMMUNITY)
Admission: AD | Admit: 2019-02-18 | Discharge: 2019-02-20 | DRG: 805 | Disposition: A | Discharge status: Home / Self Care | Payer: Medicaid Other | Attending: Obstetrics and Gynecology | Admitting: Obstetrics and Gynecology

## 2019-02-18 ENCOUNTER — Encounter (HOSPITAL_COMMUNITY): Payer: Self-pay

## 2019-02-18 ENCOUNTER — Other Ambulatory Visit: Payer: Self-pay

## 2019-02-18 DIAGNOSIS — O2662 Liver and biliary tract disorders in childbirth: Principal | ICD-10-CM | POA: Diagnosis present

## 2019-02-18 DIAGNOSIS — Z8759 Personal history of other complications of pregnancy, childbirth and the puerperium: Secondary | ICD-10-CM

## 2019-02-18 DIAGNOSIS — K831 Obstruction of bile duct: Secondary | ICD-10-CM | POA: Diagnosis present

## 2019-02-18 DIAGNOSIS — Z3A37 37 weeks gestation of pregnancy: Secondary | ICD-10-CM

## 2019-02-18 DIAGNOSIS — Z8632 Personal history of gestational diabetes: Secondary | ICD-10-CM | POA: Diagnosis present

## 2019-02-18 DIAGNOSIS — O0990 Supervision of high risk pregnancy, unspecified, unspecified trimester: Secondary | ICD-10-CM

## 2019-02-18 DIAGNOSIS — Z8719 Personal history of other diseases of the digestive system: Secondary | ICD-10-CM | POA: Diagnosis present

## 2019-02-18 LAB — CBC
HCT: 31.7 % — ABNORMAL LOW (ref 36.0–46.0)
Hemoglobin: 10.3 g/dL — ABNORMAL LOW (ref 12.0–15.0)
MCH: 27.9 pg (ref 26.0–34.0)
MCHC: 32.5 g/dL (ref 30.0–36.0)
MCV: 85.9 fL (ref 80.0–100.0)
Platelets: 333 10*3/uL (ref 150–400)
RBC: 3.69 MIL/uL — ABNORMAL LOW (ref 3.87–5.11)
RDW: 13.5 % (ref 11.5–15.5)
WBC: 9.6 10*3/uL (ref 4.0–10.5)
nRBC: 0 % (ref 0.0–0.2)

## 2019-02-18 LAB — TYPE AND SCREEN
ABO/RH(D): A POS
Antibody Screen: NEGATIVE

## 2019-02-18 MED ORDER — FENTANYL CITRATE (PF) 100 MCG/2ML IJ SOLN
INTRAMUSCULAR | Status: AC
  Filled 2019-02-18: qty 2

## 2019-02-18 MED ORDER — SIMETHICONE 80 MG PO CHEW: 80.0000 mg | CHEWABLE_TABLET | ORAL | Status: DC | PRN

## 2019-02-18 MED ORDER — SOD CITRATE-CITRIC ACID 500-334 MG/5ML PO SOLN: 30.0000 mL | ORAL | Status: DC | PRN

## 2019-02-18 MED ORDER — ONDANSETRON HCL 4 MG PO TABS: 4.0000 mg | ORAL_TABLET | ORAL | Status: DC | PRN

## 2019-02-18 MED ORDER — ACETAMINOPHEN 325 MG PO TABS: 650.0000 mg | ORAL_TABLET | ORAL | Status: DC | PRN

## 2019-02-18 MED ORDER — MISOPROSTOL 50MCG HALF TABLET
ORAL_TABLET | ORAL | Status: AC
  Filled 2019-02-18: qty 1

## 2019-02-18 MED ORDER — ZOLPIDEM TARTRATE 5 MG PO TABS: 5.0000 mg | ORAL_TABLET | Freq: Every evening | ORAL | Status: DC | PRN

## 2019-02-18 MED ORDER — OXYTOCIN 40 UNITS IN NORMAL SALINE INFUSION - SIMPLE MED
2.5000 [IU]/h | INTRAVENOUS | Status: DC
  Filled 2019-02-18: qty 1000

## 2019-02-18 MED ORDER — FENTANYL CITRATE (PF) 100 MCG/2ML IJ SOLN
100.0000 ug | Freq: Once | INTRAMUSCULAR | Status: AC
  Administered 2019-02-18: 20:00:00 100 ug via INTRAVENOUS

## 2019-02-18 MED ORDER — LACTATED RINGERS IV SOLN
INTRAVENOUS | Status: DC
  Administered 2019-02-18: 08:00:00 via INTRAVENOUS

## 2019-02-18 MED ORDER — DIPHENHYDRAMINE HCL 25 MG PO CAPS: 25.0000 mg | ORAL_CAPSULE | Freq: Four times a day (QID) | ORAL | Status: DC | PRN

## 2019-02-18 MED ORDER — TETANUS-DIPHTH-ACELL PERTUSSIS 5-2.5-18.5 LF-MCG/0.5 IM SUSP: 0.5000 mL | Freq: Once | INTRAMUSCULAR | Status: DC

## 2019-02-18 MED ORDER — MISOPROSTOL 50MCG HALF TABLET
50.0000 ug | ORAL_TABLET | ORAL | Status: DC | PRN
  Administered 2019-02-18 (×2): 50 ug via BUCCAL
  Filled 2019-02-18: qty 1

## 2019-02-18 MED ORDER — TERBUTALINE SULFATE 1 MG/ML IJ SOLN: 0.2500 mg | Freq: Once | INTRAMUSCULAR | Status: DC | PRN

## 2019-02-18 MED ORDER — ONDANSETRON HCL 4 MG/2ML IJ SOLN: 4.0000 mg | INTRAMUSCULAR | Status: DC | PRN

## 2019-02-18 MED ORDER — ACETAMINOPHEN 325 MG PO TABS
650.0000 mg | ORAL_TABLET | ORAL | Status: DC | PRN
  Administered 2019-02-19: 03:00:00 650 mg via ORAL
  Filled 2019-02-18: qty 2

## 2019-02-18 MED ORDER — PRENATAL MULTIVITAMIN CH
1.0000 | ORAL_TABLET | Freq: Every day | ORAL | Status: DC
  Administered 2019-02-19 – 2019-02-20 (×2): 1 via ORAL
  Filled 2019-02-18 (×2): qty 1

## 2019-02-18 MED ORDER — ONDANSETRON HCL 4 MG/2ML IJ SOLN: 4.0000 mg | Freq: Four times a day (QID) | INTRAMUSCULAR | Status: DC | PRN

## 2019-02-18 MED ORDER — OXYTOCIN BOLUS FROM INFUSION
500.0000 mL | Freq: Once | INTRAVENOUS | Status: AC
  Administered 2019-02-18: 20:00:00 500 mL via INTRAVENOUS

## 2019-02-18 MED ORDER — LIDOCAINE HCL (PF) 1 % IJ SOLN: 30.0000 mL | INTRAMUSCULAR | Status: DC | PRN

## 2019-02-18 MED ORDER — IBUPROFEN 600 MG PO TABS
600.0000 mg | ORAL_TABLET | Freq: Four times a day (QID) | ORAL | Status: DC
  Administered 2019-02-18 – 2019-02-20 (×6): 600 mg via ORAL
  Filled 2019-02-18 (×6): qty 1

## 2019-02-18 MED ORDER — SENNOSIDES-DOCUSATE SODIUM 8.6-50 MG PO TABS
2.0000 | ORAL_TABLET | ORAL | Status: DC
  Administered 2019-02-18 – 2019-02-20 (×2): 2 via ORAL
  Filled 2019-02-18 (×2): qty 2

## 2019-02-18 MED ORDER — MEASLES, MUMPS & RUBELLA VAC IJ SOLR
0.5000 mL | Freq: Once | INTRAMUSCULAR | Status: AC
  Administered 2019-02-20: 12:00:00 0.5 mL via SUBCUTANEOUS
  Filled 2019-02-18: qty 0.5

## 2019-02-18 MED ORDER — BENZOCAINE-MENTHOL 20-0.5 % EX AERO: 1.0000 "application " | INHALATION_SPRAY | CUTANEOUS | Status: DC | PRN

## 2019-02-18 MED ORDER — DIBUCAINE (PERIANAL) 1 % EX OINT: 1.0000 "application " | TOPICAL_OINTMENT | CUTANEOUS | Status: DC | PRN

## 2019-02-18 MED ORDER — LACTATED RINGERS IV SOLN: 500.0000 mL | INTRAVENOUS | Status: DC | PRN

## 2019-02-18 MED ORDER — COCONUT OIL OIL: 1.0000 "application " | TOPICAL_OIL | Status: DC | PRN

## 2019-02-18 MED ORDER — WITCH HAZEL-GLYCERIN EX PADS: 1.0000 "application " | MEDICATED_PAD | CUTANEOUS | Status: DC | PRN

## 2019-02-18 MED ORDER — OXYCODONE-ACETAMINOPHEN 5-325 MG PO TABS: 2.0000 | ORAL_TABLET | ORAL | Status: DC | PRN

## 2019-02-18 MED ORDER — OXYCODONE-ACETAMINOPHEN 5-325 MG PO TABS: 1.0000 | ORAL_TABLET | ORAL | Status: DC | PRN

## 2019-02-18 NOTE — Progress Notes (Signed)
Labor Progress Note Colleen Coleman is a 28 y.o. [phone] at [redacted]w[redacted]d presented for IOL for cholestasis  S:  Patient signficantly more uncomfortable with contractions. Reporting some bleeding.   O:  BP 115/69   Pulse 85   Temp 99.1 F (37.3 C) (Oral)   Resp 18   Ht 5\' 1"  (1.549 m)   Wt 78 kg   LMP 05/30/2018 (Approximate)   BMI 32.50 kg/m   Fetal Tracing:  Baseline: 140 Variability: moderate Accels: 15x15 Decels: none  Toco: 1-2   CVE: Dilation: 2 Effacement (%): 50 Cervical Position: Middle Station: -3 Presentation: Vertex Exam by:: Katrinka Blazing, RNC    A&P: 28 y.o. [id number] [redacted]w[redacted]d IOL Cholestasis #Labor: Small amount of bleeding noted on inner thighs and on exam. FHR reactive and good resting tone between contractions. Suspect patient is moving into active labor. Will continue to monitor bleeding. #Pain: per patient request #FWB: Cat 1 #GBS negative  Wende Mott, CNM 4:47 PM

## 2019-02-18 NOTE — H&P (Signed)
OBSTETRIC ADMISSION HISTORY AND PHYSICAL  Colleen Coleman is a 28 y.o. female [phone] with IUP at 6813w2d by 18 week u/s presenting for IOL for cholestasis. She reports +FMs, No LOF, no VB, no blurry vision, headaches or peripheral edema, and RUQ pain.  She plans on breast feeding. She request NuvaRing for birth control, desires BTL if goes for c/s. [facility]   Dating: By 18 week u/s --->  Estimated Date of Delivery: 03/09/19   Nursing Staff Provider  Office Location  [facility] Dating  18 weeks  Language  Spanish Anatomy US  Normal  Flu Vaccine  09/11/2018 Genetic Screen  Not done    TDaP vaccine   12/01/2018 Hgb A1C or  GTT Third trimester Nml 2 hr GTT  Rhogam  n/a   LAB RESULTS   Feeding Plan Breast Blood Type   A+  Contraception BTL if c/s, OCPs if not Antibody   Negative  Circumcision Undecided Rubella Nonimmune (03/09 0000)  Pediatrician  [facility]. Dr. Duffy RhodyStanley RPR Non Reactive (05/29 0903)   Support Person Jose'(FOB) HBsAg Negative (03/09 0000)   Prenatal Classes No HIV Non Reactive (05/29 0903)  BTL Consent  GBS   negative  VBAC Consent NA Pap Normal   Prenatal History/Complications:  Past Medical History: Past Medical History:  Diagnosis Date  . Cholelithiasis complicating pregnancy in third trimester, antepartum   . Cholestasis of pregnancy in third trimester 02/19/2016  . Depression   . Gestational diabetes    with 3rd pregnancy  . Hx of chlamydia infection   . Hyperemesis arising during pregnancy   . Obesity (BMI 30.0-34.9)   . Supervision of high risk pregnancy, antepartum 01/12/2016    [facility] Prenatal Labs Dating  Blood type: A/Positive/-- (03/27 0000) A+ Genetic Screen 1 Screen:    AFP:     Quad:     NIPS: Antibody:Negative (03/27 0000) Anatomic US At HD nl Rubella: Immune (03/27 0000) GTT Early:               Third trimester:  RPR: Nonreactive (06/19 0000)  Flu vaccine  09/2015) HBsAg: Negative (03/27 0000)  TDaP  vaccine     Re eived                                     . Varicose veins    back of both legs    Past Surgical History: Past Surgical History:  Procedure Laterality Date  . NO PAST SURGERIES      Obstetrical History: OB History    Gravida  4   Para  3   Term  3   Preterm      AB      Living  3     SAB      TAB      Ectopic      Multiple  0   Live Births  3           Social History: Social History   Socioeconomic History  . Marital status: Single    Spouse name: Not on file  . Number of children: Not on file  . Years of education: Not on file  . Highest education level: Not on file  Occupational History  . Not on file  Social Needs  . Financial resource strain: Not on file  . Food insecurity  Worry: Not on file    Inability: Not on file  . Transportation needs    Medical: Not on file    Non-medical: Not on file  Tobacco Use  . Smoking status: Never Smoker  . Smokeless tobacco: Never Used  Substance and Sexual Activity  . Alcohol use: No  . Drug use: No  . Sexual activity: Not Currently    Birth control/protection: Condom  Lifestyle  . Physical activity    Days per week: Not on file    Minutes per session: Not on file  . Stress: Not on file  Relationships  . Social Musicianconnections    Talks on phone: Not on file    Gets together: Not on file    Attends religious service: Not on file    Active member of club or organization: Not on file    Attends meetings of clubs or organizations: Not on file    Relationship status: Not on file  Other Topics Concern  . Not on file  Social History Narrative  . Not on file    Family History: Family History  Problem Relation Age of Onset  . Other Mother        varicosities  . Hypertension Sister   . Other Sister        varicosities  . Depression Sister     Allergies: Allergies  Allergen Reactions  . Penicillins Hives    Has patient had a PCN reaction causing immediate rash,  facial/tongue/throat swelling, SOB or lightheadedness with hypotension: unknown, childhood Has patient had a PCN reaction causing severe rash involving mucus membranes or skin necrosis:  Has patient had a PCN reaction that required hospitalization  Has patient had a PCN reaction occurring within the last 10 years  If all of the above answers are "NO", then may proceed with Cephalosporin use.   . Shrimp [Shellfish Allergy] Hives    Medications Prior to Admission  Medication Sig Dispense Refill Last Dose  . acetaminophen (TYLENOL) 325 MG tablet Take 325 mg by mouth every 6 (six) hours as needed (pain). Reported on 01/15/2016     . aspirin EC 81 MG tablet Take 1 tablet (81 mg total) by mouth daily. Take after 12 weeks for prevention of preeclampsia later in pregnancy 300 tablet 2   . cyclobenzaprine (FLEXERIL) 10 MG tablet Take 1 tablet (10 mg total) by mouth 3 (three) times daily as needed for muscle spasms. 30 tablet 2   . Prenatal Vit-Fe Fumarate-FA (PRENATAL MULTIVITAMIN) TABS tablet Take 1 tablet by mouth daily at 12 noon.     . ursodiol (ACTIGALL) 300 MG capsule Take 1 capsule (300 mg total) by mouth 3 (three) times daily. 90 capsule 3      Review of Systems   All systems reviewed and negative except as stated in HPI  Blood pressure 122/74, pulse (!) 103, temperature 98.6 F (37 C), temperature source Oral, resp. rate 16, height 5\' 1"  (1.549 m), weight 78 kg, last menstrual period 05/30/2018, currently breastfeeding. General appearance: alert, cooperative and no distress Lungs: clear to auscultation bilaterally Heart: regular rate and rhythm Abdomen: soft, non-tender; bowel sounds normal Pelvic: n/a Extremities: Homans sign is negative, no sign of DVT DTR's +2 Presentation: cephalic Fetal monitoringBaseline: 140 bpm, Variability: Good {> 6 bpm), Accelerations: Reactive and Decelerations: Absent Uterine activity: occasional uc's     Prenatal labs: ABO, Rh: --/--/A POS (08/16  0818) Antibody: NEG (08/16 0818) Rubella: Nonimmune (03/09 0000) RPR: Non Reactive (05/29 0903)  HBsAg: Negative (03/09 0000)  HIV: Non Reactive (05/29 0903)  GBS:     Prenatal Transfer Tool  Maternal Diabetes: No Genetic Screening: Declined Maternal Ultrasounds/Referrals: Normal Fetal Ultrasounds or other Referrals:  Referred to Materal Fetal Medicine  Maternal Substance Abuse:  No Significant Maternal Medications:  None Significant Maternal Lab Results: Group B Strep negative  Results for orders placed or performed during the hospital encounter of 02/18/19 (from the past 24 hour(s))  CBC   Collection Time: 02/18/19  8:18 AM  Result Value Ref Range   WBC 9.6 4.0 - 10.5 K/uL   RBC 3.69 (L) 3.87 - 5.11 MIL/uL   Hemoglobin 10.3 (L) 12.0 - 15.0 g/dL   HCT 31.7 (L) 36.0 - 46.0 %   MCV 85.9 80.0 - 100.0 fL   MCH 27.9 26.0 - 34.0 pg   MCHC 32.5 30.0 - 36.0 g/dL   RDW 13.5 11.5 - 15.5 %   Platelets 333 150 - 400 K/uL   nRBC 0.0 0.0 - 0.2 %  Type and screen   Collection Time: 02/18/19  8:18 AM  Result Value Ref Range   ABO/RH(D) A POS    Antibody Screen NEG    Sample Expiration      02/21/2019,2359 Performed at [facility], [place] [address], [place], [place] [address]     Patient Active Problem List   Diagnosis Date Noted  . Cholestasis during pregnancy in third trimester 02/18/2019  . Supervision of high risk pregnancy, antepartum 10/05/2018  . History of gestational diabetes 10/05/2018  . History of gestational hypertension 10/05/2018  . Cholestasis during pregnancy 01/12/2016    Assessment/Plan:  Colleen Coleman is a 28 y.o. [phone] at [redacted]w[redacted]d here for IOL for cholestasis  #Labor: cytotec and FB when able #Pain: Per patient request #FWB: Cat 1 #ID:  GBS neg #MOF: Breast #MOC: NuvaRing, discussed implications with breastfeeding #Circ:  [place], CNM  02/18/2019, 9:16 AM

## 2019-02-18 NOTE — Progress Notes (Signed)
Interpreter in room:  Pt states she does not desire anything for pain during her labor.  This nurse discussed IV pain medicine and epidural with patient through the [place] interpreter.

## 2019-02-18 NOTE — Discharge Summary (Signed)
OB Discharge Summary     Patient Name: Colleen Coleman DOB: 26-Dec-1990 MRN: [id number]  Date of admission: 02/18/2019 Delivering MD: Nicolette Bang   Date of discharge: 02/20/2019  Admitting diagnosis: pregnancy Intrauterine pregnancy: [redacted]w[redacted]d     Secondary diagnosis:  Active Problems:   History of gestational diabetes   History of gestational hypertension   Cholestasis during pregnancy in third trimester   SVD (spontaneous vaginal delivery)  Additional problems: none     Discharge diagnosis: Term Pregnancy Delivered and Cholestasis                                                                                                 Post partum procedures:none  Augmentation: Cytotec  Complications: None  Hospital course:  Induction of Labor With Vaginal Delivery   28 y.o. yo [phone] at [redacted]w[redacted]d was admitted to the hospital 02/18/2019 for induction of labor.  Indication for induction: Cholestasis of pregnancy.  Patient had an uncomplicated labor course as follows: Membrane Rupture Time/Date: 5:09 PM ,02/18/2019   Intrapartum Procedures: Episiotomy: None [1]                                         Lacerations:  None [1]  Patient had delivery of a Viable infant.  Information for the patient's newborn:  Colleen Coleman [[id number]]      02/18/2019  Details of delivery can be found in separate delivery note.  Patient had a routine postpartum course. Patient is discharged home 02/20/19.  Physical exam  Vitals:   02/19/19 0423 02/19/19 1656 02/19/19 2107 02/20/19 0631  BP: (!) 84/37 (!) 94/55 111/67 (!) 87/52  Pulse: 70 65 70 72  Resp: 16 18 18 18   Temp:  (!) 97 F (36.1 C) 98.7 F (37.1 C) 98.4 F (36.9 C)  TempSrc:  Oral Oral Oral  SpO2: 100%  99% 98%  Weight:      Height:       General: alert, cooperative and no distress Lochia: appropriate Uterine Fundus: firm Incision: N/A DVT Evaluation: No evidence of DVT seen on physical exam. No cords or  calf tenderness. No significant calf/ankle edema. Labs: Lab Results  Component Value Date   WBC 9.6 02/18/2019   HGB 10.3 (L) 02/18/2019   HCT 31.7 (L) 02/18/2019   MCV 85.9 02/18/2019   PLT 333 02/18/2019   CMP Latest Ref Rng & Units 12/01/2018  Glucose 65 - 99 mg/dL 136(H)  BUN 6 - 20 mg/dL 4(L)  Creatinine 0.57 - 1.00 mg/dL 0.45(L)  Sodium 134 - 144 mmol/L 136  Potassium 3.5 - 5.2 mmol/L 3.5  Chloride 96 - 106 mmol/L 101  CO2 20 - 29 mmol/L 19(L)  Calcium 8.7 - 10.2 mg/dL 8.8  Total Protein 6.0 - 8.5 g/dL 6.4  Total Bilirubin 0.0 - 1.2 mg/dL <0.2  Alkaline Phos 39 - 117 IU/L 154(H)  AST 0 - 40 IU/L 26  ALT 0 - 32 IU/L 28    Discharge instruction: per  After Visit Summary and "Baby and Me Booklet".  After visit meds:  Allergies as of 02/20/2019      Reactions   Penicillins Hives   Has patient had a PCN reaction causing immediate rash, facial/tongue/throat swelling, SOB or lightheadedness with hypotension: unknown, childhood Has patient had a PCN reaction causing severe rash involving mucus membranes or skin necrosis:  Has patient had a PCN reaction that required hospitalization  Has patient had a PCN reaction occurring within the last 10 years  If all of the above answers are "NO", then may proceed with Cephalosporin use.   Shrimp [shellfish Allergy] Hives      Medication List    STOP taking these medications   aspirin EC 81 MG tablet   ursodiol 300 MG capsule Commonly known as: ACTIGALL     TAKE these medications   acetaminophen 325 MG tablet Commonly known as: TYLENOL Take 325 mg by mouth every 6 (six) hours as needed (pain). Reported on 01/15/2016   cyclobenzaprine 10 MG tablet Commonly known as: FLEXERIL Take 1 tablet (10 mg total) by mouth 3 (three) times daily as needed for muscle spasms.   prenatal multivitamin Tabs tablet Take 1 tablet by mouth daily at 12 noon.       Diet: routine diet  Activity: Advance as tolerated. Pelvic rest for 6 weeks.    Outpatient follow up:4 weeks Follow up Appt:No future appointments. Follow up Visit:No follow-ups on file.   Please schedule this patient for Postpartum visit in: 4 weeks with the following provider: Any provider For C/S patients schedule nurse incision check in weeks 2 weeks: no High risk pregnancy complicated by: cholestasis, h/o GDM with previous pregnancy, h/o FGR with previous pregnancy  Delivery mode:  SVD Anticipated Birth Control:  NuvaRing  PP Procedures needed: None   Schedule Integrated BH visit: no   Postpartum contraception: NuvaRing  Newborn Data: Live born female  Birth Weight:  2481g  APGAR: 9, 9  Newborn Delivery   Birth date/time: 02/18/2019 20:22:00 Delivery type: Vaginal, Spontaneous      Baby Feeding: Breast Disposition:home with mother   02/20/2019 Sharyon CableVeronica C Cicely Ortner, CNM

## 2019-02-18 NOTE — Progress Notes (Signed)
Labor Progress Note khianna blazina is a 28 y.o. [phone] at [redacted]w[redacted]d presented for IOL for cholestasis  S:  Patient very uncomfortable  O:  BP 113/72   Pulse 72   Temp 99.5 F (37.5 C) (Oral)   Resp 18   Ht 5\' 1"  (1.549 m)   Wt 78 kg   LMP 05/30/2018 (Approximate)   BMI 32.50 kg/m   Fetal Tracing:  Baseline: 135 Variability: moderate Accels: 15x15 Decels: none  Toco: 2-3   CVE: Dilation: 6 Effacement (%): 100 Cervical Position: Middle Station: -2 Presentation: Vertex Exam by:: Len Blalock, CNM    A&P: 28 y.o. [phone] [redacted]w[redacted]d IOL cholestasis #Labor: Progressing well, SROM at 1730. Bloody show on exam. Will continue expectant management and anticipate NSVD soon #Pain: per patient request #FWB: Cat 1 #GBS negative   Wende Mott, CNM 6:21 PM

## 2019-02-19 ENCOUNTER — Encounter (HOSPITAL_COMMUNITY): Payer: Self-pay

## 2019-02-19 NOTE — Lactation Note (Signed)
This note was copied from a baby's chart. Lactation Consultation Note Baby 5 hrs old. Patient Name: Colleen Coleman [id number] Date: 02/19/2019 Reason for consult: Initial assessment;Early term 37-38.6wks;Infant < 6lbs   Maternal Data Has patient been taught Hand Expression?: Yes Does the patient have breastfeeding experience prior to this delivery?: Yes  Feeding    LATCH Score                   Interventions Interventions: DEBP;Breast feeding basics reviewed  Lactation Tools Discussed/Used Tools: Pump Breast pump type: Double-Electric Breast Pump WIC Program: No Pump Review: Setup, frequency, and cleaning;Milk Storage Initiated by:: Allayne Stack RN IBCLC Date initiated:: 02/19/19   Consult Status Consult Status: Follow-up Date: 02/20/19 Follow-up type: In-patient    Colleen Coleman, [place] 02/19/2019, 2:00 AM

## 2019-02-19 NOTE — Progress Notes (Signed)
POSTPARTUM PROGRESS NOTE  Post Partum Day 1  Subjective:  Colleen Coleman is a 28 y.o. [id number] s/p SVD at [redacted]w[redacted]d.  She reports she is doing well. No acute events overnight. She denies any problems with ambulating, voiding or po intake. Denies nausea or vomiting.  Pain is well controlled.  Lochia is appropriate.  Objective: Blood pressure (!) 84/37, pulse 70, temperature 98.2 F (36.8 C), resp. rate 16, height 5\' 1"  (1.549 m), weight 78 kg, last menstrual period 05/30/2018, SpO2 100 %, unknown if currently breastfeeding.  Physical Exam:  General: alert, cooperative and no distress Chest: no respiratory distress Heart:regular rate, distal pulses intact Abdomen: soft, nontender,  Uterine Fundus: firm, appropriately tender DVT Evaluation: No calf swelling or tenderness Extremities: No LE edema Skin: warm, dry  Recent Labs    02/18/19 0818  HGB 10.3*  HCT 31.7*    Assessment/Plan: Colleen Coleman is a 28 y.o. [phone] s/p SVD at [redacted]w[redacted]d   PPD#1 - Doing well  Routine postpartum care Contraception: NuvaRing Feeding: Breast  Dispo: Plan for discharge PPD#2.   LOS: 1 day   Phill Myron, D.O. OB Fellow  02/19/2019, 6:29 AM

## 2019-02-19 NOTE — Progress Notes (Signed)
Pt reporst calf pain in the right leg when standing. Both legs appear to be equal in size and coll to the touch. Will notify MD.

## 2019-02-19 NOTE — Progress Notes (Signed)
MOB was referred for history of depression.  * Referral screened out by Clinical Social Worker because none of the following criteria appear to apply:  ~ History of anxiety/depression during this pregnancy, or of post-partum depression following prior delivery. ~ Diagnosis of anxiety and/or depression within last 3 years. MOB's depression appears to date back to 2014 and no concerns were noted in [facility] records. OR * MOB's symptoms currently being treated with medication and/or therapy.  Please contact the Clinical Social Worker if needs arise, by [facility] request, or if MOB scores greater than 9/yes to question 10 on Edinburgh Postpartum Depression Screen.  Elijio Miles, [place]  [facility] and [employer] [phone]

## 2019-02-19 NOTE — Lactation Note (Signed)
This note was copied from a baby's chart. Lactation Consultation Note  Patient Name: Colleen Coleman [name] Date: 02/19/2019 Reason for consult: Other (Comment);Follow-up assessment;Early term 37-38.6wks;Infant < 6lbs;Infant weight loss(IUGR)  59 hours old ETI < 6 lbs female who is being mostly BF by her mother, she's a P4 and experienced BF. Baby is at 1% weight loss, mom was told to start supplementing with formula just a few hours after birth since there wasn't any colostrum available at that time but baby did not take it well, she was very spitty and had a choking episode, mom got really scared. Both of baby's serum glucose were above 40 yesterday at 51 and 60 respectively. Mom stopped supplementing today and she's been putting baby to the breast on cues. Baby just started cluster feeding this afternoon.  Baby already nursing when entering the room, but mom not doing STS. Reviewed the importance of STS care and advised mom to try it on posterior feedings. LC noted a few audible swallows upon breast compressions, but baby will nurse on and off falling asleep and taking breaks in between. Mom said she's pumped 3 times today but that "nothing" came out. Explained to mom that pumping early on is mainly for breast stimulation and not to get volume; she voiced understanding.   Reviewed normal newborn behavior after the first 24 hours of life, feeding cues, cluster feeding and typical ETI/LPI feeding patterns. Mom understands that it's going to take a few weeks for baby to be able to fully empty the breast and she'll have to pump after feedings in order to protect her supply.  Feeding plan:  1. Encouraged mom to continue feeding baby STS 8-12 times/24 hours or sooner if feeding cues are present. Mom will limit the feedings to no more than 30 minutes at a time 2. She'll try pumping after feedings and will feed her EMB first to baby prior offering any formula 3. She'll starting supplementing  again with Similac 22 calorie formula after baby is done spitting amnionic around the 24 hours mark, she'll do that after every feeding at the breast, between 10-20 ml per feeding  Mom reported all questions and concerns were answered, she's aware of [facility] services and will call PRN.  Maternal Data    Feeding Feeding Type: Breast Fed  LATCH Score Latch: Grasps breast easily, tongue down, lips flanged, rhythmical sucking.  Audible Swallowing: A few with stimulation  Type of Nipple: Everted at rest and after stimulation  Comfort (Breast/Nipple): Soft / non-tender  Hold (Positioning): Assistance needed to correctly position infant at breast and maintain latch.  LATCH Score: 8  Interventions Interventions: Breast feeding basics reviewed;Assisted with latch;Breast compression;Support pillows;DEBP  Lactation Tools Discussed/Used Tools: Pump Breast pump type: Double-Electric Breast Pump   Consult Status Consult Status: Follow-up Date: 02/20/19 Follow-up type: In-patient    Colleen Coleman 02/19/2019, 6:21 PM

## 2019-02-19 NOTE — Progress Notes (Signed)
Ordered pt food, by Juliann Mule Spanish Interpreter.

## 2019-02-20 DIAGNOSIS — Z3A37 37 weeks gestation of pregnancy: Secondary | ICD-10-CM

## 2019-02-20 DIAGNOSIS — O2662 Liver and biliary tract disorders in childbirth: Secondary | ICD-10-CM

## 2019-02-20 DIAGNOSIS — K831 Obstruction of bile duct: Secondary | ICD-10-CM

## 2019-02-20 LAB — RPR: RPR Ser Ql: NONREACTIVE

## 2019-02-20 LAB — BIRTH TISSUE RECOVERY COLLECTION (PLACENTA DONATION)

## 2019-02-20 NOTE — Lactation Note (Addendum)
This note was copied from a baby's chart. Lactation Consultation Note  Patient Name: Colleen Coleman Date: 02/20/2019   Deborha Payment interpreter present for [place].  Bbay 4 hours old.  [redacted]w[redacted]d. 5.4% weight loss.  4 voids/6 stools in the last 24 hours.  Mother is breastfeeding and supplementing with formula. Reviewed supplementation volume guidelines per LPI information sheet. Mother recently breastfed for 20 min. Mother denies questions or problems. Mother plans to post pump w/ manual pump. Feed on demand approximately 8-12 times per day at least q 3 hours.   Reviewed engorgement care and monitoring voids/stools.       Maternal Data    Feeding    LATCH Score                   Interventions    Lactation Tools Discussed/Used     Consult Status      Carlye Grippe 02/20/2019, 9:38 AM

## 2019-03-19 ENCOUNTER — Encounter: Payer: Self-pay | Admitting: Nurse Practitioner

## 2019-03-19 ENCOUNTER — Telehealth: Payer: Self-pay | Admitting: Obstetrics and Gynecology

## 2019-03-19 NOTE — Telephone Encounter (Signed)
Called the patient with the in office intrepter [name].

## 2019-03-19 NOTE — Telephone Encounter (Signed)
The patient stated she would like to have the nuvaring. She doesn't have a babysitter for tomorrow. Informed the patient of changing to a virtual visit. She also would like to know if the nuvaring can be sent to the pharmacy or what she can do to continue with the method without coming into office.

## 2019-03-20 ENCOUNTER — Other Ambulatory Visit: Payer: Self-pay

## 2019-03-20 ENCOUNTER — Telehealth (INDEPENDENT_AMBULATORY_CARE_PROVIDER_SITE_OTHER): Payer: Self-pay | Admitting: Nurse Practitioner

## 2019-03-20 ENCOUNTER — Encounter: Payer: Self-pay | Admitting: Nurse Practitioner

## 2019-03-20 DIAGNOSIS — L299 Pruritus, unspecified: Secondary | ICD-10-CM

## 2019-03-20 DIAGNOSIS — Z8632 Personal history of gestational diabetes: Secondary | ICD-10-CM

## 2019-03-20 DIAGNOSIS — Z30011 Encounter for initial prescription of contraceptive pills: Secondary | ICD-10-CM

## 2019-03-20 MED ORDER — NORETHINDRONE 0.35 MG PO TABS: 1.0000 | ORAL_TABLET | Freq: Every day | ORAL | 11 refills | Status: AC

## 2019-03-20 NOTE — Progress Notes (Addendum)
Webex connected with  Colleen Coleman on 03/20/19 at  1:15 PM EDT by telephone and verified that I am speaking with the correct person using two identifiers.  Interpreter present for entire conversation.   I discussed the limitations, risks, security and privacy concerns of performing an evaluation and management service by telephone and the availability of in person appointments. I also discussed with the patient that there may be a patient responsible charge related to this service. The patient expressed understanding and agreed to proceed.  Alric Seton, [place] 03/20/2019  1:07 PM      TELEHEALTH POSTPARTUM VIRTUAL VIDEO VISIT ENCOUNTER NOTE   Provider location: Center for [employer] at [employer]   I connected with Colleen Coleman on 03/20/19 at  1:15 PM EDT by WebEx Video Encounter at home and verified that I am speaking with the correct person using two identifiers.    I discussed the limitations, risks, security and privacy concerns of performing an evaluation and management service virtually and the availability of in person appointments. I also discussed with the patient that there may be a patient responsible charge related to this service. The patient expressed understanding and agreed to proceed.  Interpreter present for the entire visit.  Chief Complaint: Postpartum Visit  History of Present Illness: Colleen Coleman is a 28 y.o. Hispanic [id number] being evaluated for postpartum followup.    She is s/p SVD on 02/18/2019 at 37 weeks; she was discharged to home on 02/20/2019. Pregnancy complicated by gdm . Baby is doing well.  Complains of continued itching that is bothersome but not as severe as what she had in pregnancy.  Had cholestasis in 2 pregnancies.  In the first pregnancy the itching stopped when the baby was born, but this time the itching is continuing particularly on her face and arms.  No rash.  Vaginal bleeding or discharge: No  Intercourse: No   Contraception: desires NuvaRing vaginal inserts Mode of feeding infant: Breast PP depression s/s: No .  Any bowel or bladder issues: Yes  Pap smear: no abnormalities (date: 09/11/2018)  Review of Systems: Positive for itching. Her 12 point review of systems is negative or as noted in the History of Present Illness.  Patient Active Problem List   Diagnosis Date Noted  . History of cholestasis during pregnancy 02/18/2019  . SVD (spontaneous vaginal delivery) 02/18/2019  . Supervision of high risk pregnancy, antepartum 10/05/2018  . History of gestational diabetes 10/05/2018  . History of gestational hypertension 10/05/2018  . Cholestasis during pregnancy 01/12/2016    Medications Colleen Coleman had no medications administered during this visit. Current Outpatient Medications  Medication Sig Dispense Refill  . acetaminophen (TYLENOL) 325 MG tablet Take 325 mg by mouth every 6 (six) hours as needed (pain). Reported on 01/15/2016    . cyclobenzaprine (FLEXERIL) 10 MG tablet Take 1 tablet (10 mg total) by mouth 3 (three) times daily as needed for muscle spasms. 30 tablet 2  . Prenatal Vit-Fe Fumarate-FA (PRENATAL MULTIVITAMIN) TABS tablet Take 1 tablet by mouth daily at 12 noon.     No current facility-administered medications for this visit.     Allergies Penicillins and Shrimp [shellfish allergy]  Physical Exam:  LMP 05/30/2018 (Approximate)  General:  Alert, oriented and cooperative. Patient is in no acute distress.  Mental Status: Normal mood and affect. Normal behavior. Normal judgment and thought content.   Respiratory: Normal respiratory effort noted, no problems with respiration noted  Rest of physical exam  deferred due to type of encounter  PP Depression Screening:   Edinburgh Postnatal Depression Scale Screening Tool 02/19/2019 02/18/2019  I have been able to laugh and see the funny side of things. (No Data) (No Data)     Assessment:Patient is a 28 y.o. [id number] who  is 4 weeks postpartum from a normal spontaneous vaginal delivery.  She is doing very well except for continued itching.   Plan: 1.  Encounter for postpartum visit and Family Planning - starting Micronor Wanted Nuvaring but client is breastfeeding.  Additionally she has never used tampons.  Advised that Nuvaring may decrease supply of breastmilk and other options discussed for contraception.  Client would like Nexplanon as she does not desire any more children.  Declines IUD.  Does not have insurance so advised that [facility]  Client is aware of the [facility] Department and plans to call.  Will prescribe Micronor to be taking in the meantime until she can get an appointment there.  Micronor will not decrease supply of breastmilk.  2.  Continued itching from cholestasis Consulted with Dr. Crissie ReeseEckstat and will have client schedule a lab visit for CBC, CMP and Bile Acids.  Front desk to schedule lab visit and call client to inform her of the visit.  RTC in one year.  I discussed the assessment and treatment plan with the patient. The patient was provided an opportunity to ask questions and all were answered. The patient agreed with the plan and demonstrated an understanding of the instructions.   The patient was advised to call back or seek an in-person evaluation/go to the ED for any concerning postpartum symptoms.  I provided 12 minutes of face-to-face time during this encounter.   Ernestina PatchesNicole S Capel, CMA [facility], [facility]  Nolene BernheimERRI Albena Comes, RN, MSN, NP-BC Nurse Practitioner, [facility], [facility] 03/20/2019 8:09 PM

## 2019-03-21 ENCOUNTER — Other Ambulatory Visit: Payer: Self-pay

## 2019-03-21 DIAGNOSIS — L299 Pruritus, unspecified: Secondary | ICD-10-CM

## 2019-03-22 LAB — CBC
Hematocrit: 37.8 % (ref 34.0–46.6)
Hemoglobin: 12.5 g/dL (ref 11.1–15.9)
MCH: 27.1 pg (ref 26.6–33.0)
MCHC: 33.1 g/dL (ref 31.5–35.7)
MCV: 82 fL (ref 79–97)
Platelets: 316 10*3/uL (ref 150–450)
RBC: 4.61 x10E6/uL (ref 3.77–5.28)
RDW: 14.5 % (ref 11.7–15.4)
WBC: 7.9 10*3/uL (ref 3.4–10.8)

## 2019-03-22 LAB — BILE ACIDS, TOTAL: Bile Acids Total: 7.5 umol/L (ref 0.0–10.0)

## 2019-03-28 ENCOUNTER — Other Ambulatory Visit: Payer: Self-pay | Admitting: Nurse Practitioner

## 2019-03-28 NOTE — Progress Notes (Signed)
Colleen Coleman 28 y.o. Postpartum visit, client  was having continued itching from cholestasis.  Reported when she had cholestasis in a previous pregnancy, the itching stopped postpartum but this time the itching did not stop.  Ordered CBC, Bile Acids and CMP.  Only CBC and bile acids were drawn.  Results reviewed - bile acids more than during pregnancy.  Consult with Dr. Rip Harbour.  Draw CMP - appointment made for Friday.  If itching is still a problem, order abdominal US and send to PCP as something in addition to cholestasis may be occurring.   Plan - client to come on Friday for blood draw for CMP.  [name], RN, MSN, NP-BC Nurse Practitioner, [facility] for [employer], [facility] 03/28/2019 8:58 AM

## 2019-03-29 ENCOUNTER — Telehealth: Payer: Self-pay | Admitting: Family Medicine

## 2019-03-29 NOTE — Telephone Encounter (Signed)
Spoke with patient w/ Spanish interpreter ID# [phone] about her appointment on 9/25 @ 8:20. Patient instructed to wear a face mask for the entire appointment and no visitors are allowed. Patient screened for covid symptoms and denied having any.

## 2019-03-30 ENCOUNTER — Other Ambulatory Visit: Payer: Self-pay

## 2020-03-14 ENCOUNTER — Other Ambulatory Visit: Payer: Self-pay | Admitting: Critical Care Medicine

## 2020-03-14 ENCOUNTER — Other Ambulatory Visit: Payer: Self-pay

## 2020-03-14 DIAGNOSIS — Z20822 Contact with and (suspected) exposure to covid-19: Secondary | ICD-10-CM

## 2020-03-17 LAB — NOVEL CORONAVIRUS, NAA: SARS-CoV-2, NAA: NOT DETECTED

## 2020-09-03 IMAGING — US US MFM FETAL BPP WO NON STRESS
1 series · 14 of 28 positions shown · non-contrast
Comparison: none

[Series 1: us mfm fetal bpp wo non stress · 43 acquisitions, 14 frames shown]
[im 2/43]
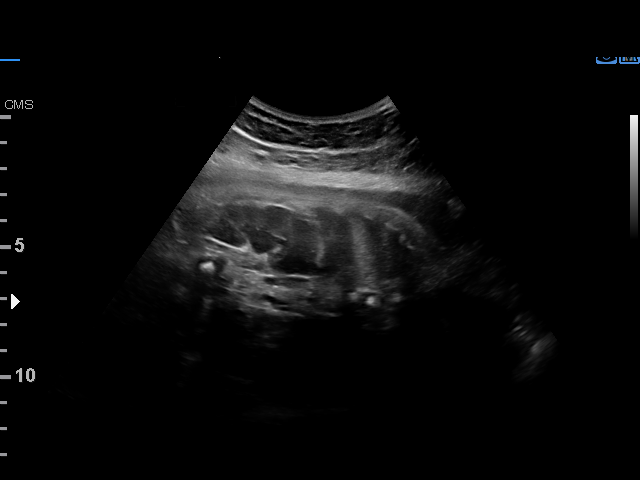
[im 5/43]
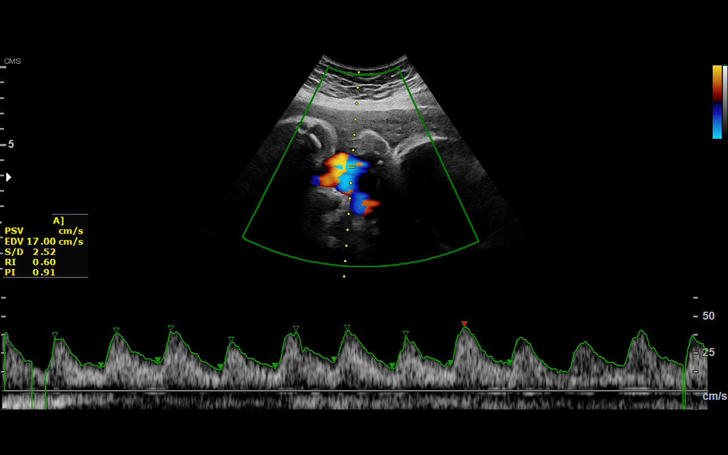
[im 8/43]
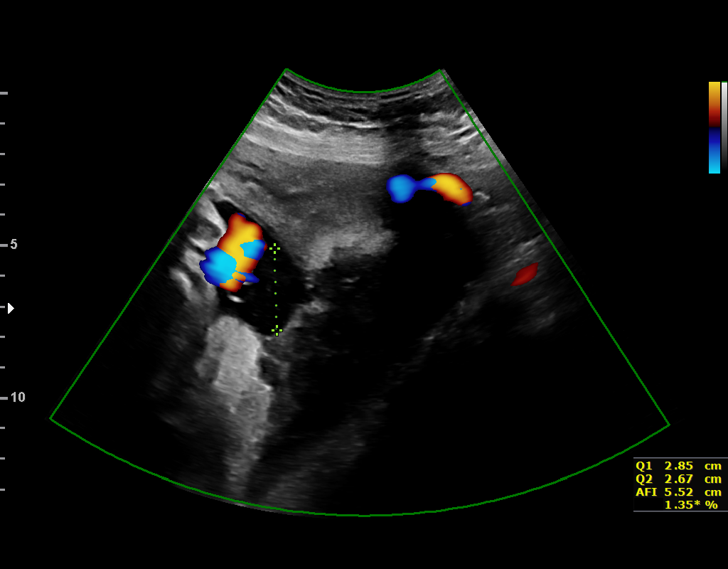
[im 11/43]
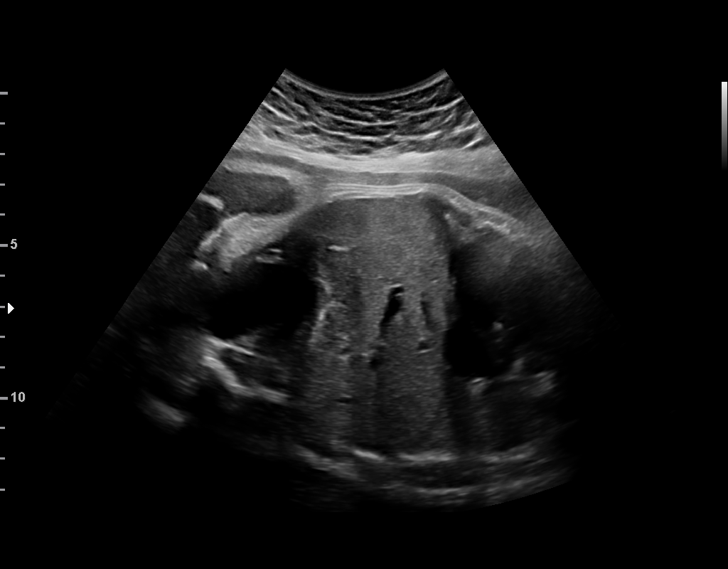
[im 15/43]
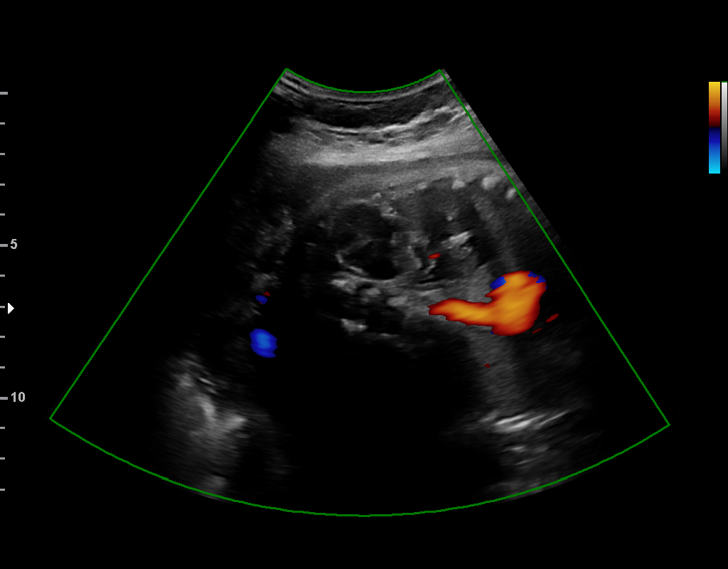
[im 18/43]
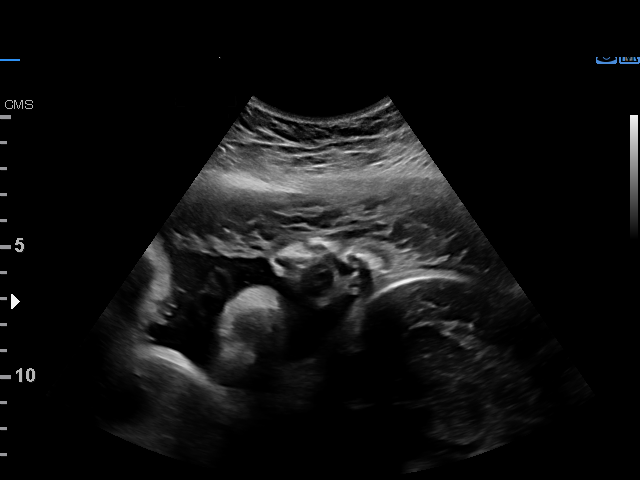
[im 21/43]
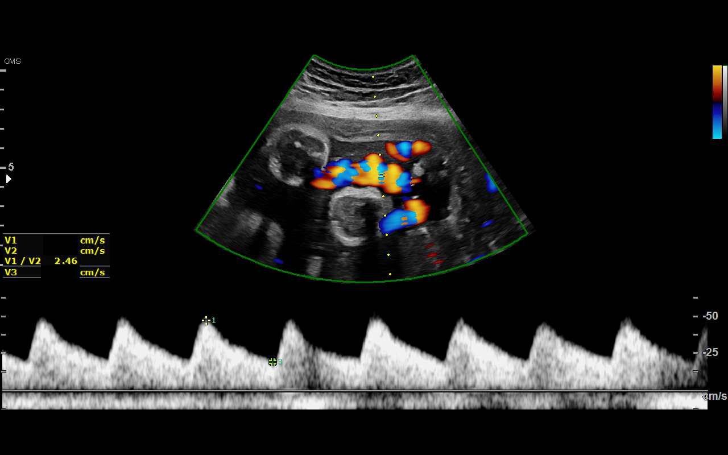
[im 24/43]
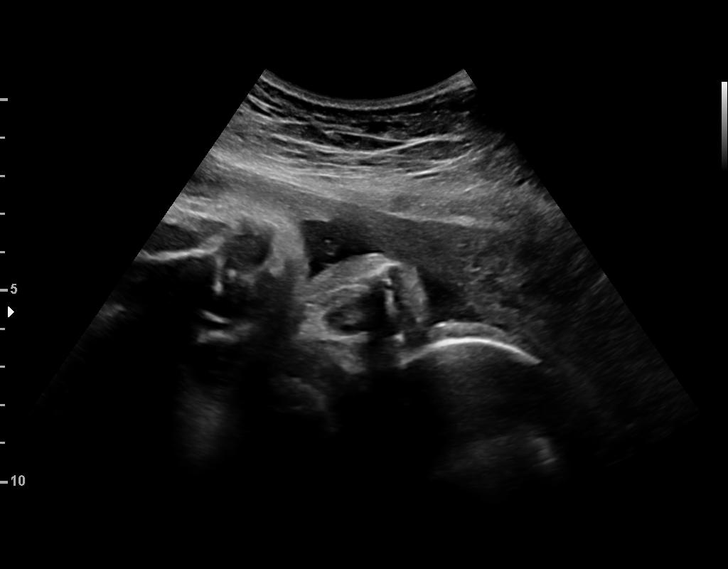
[im 27/43]
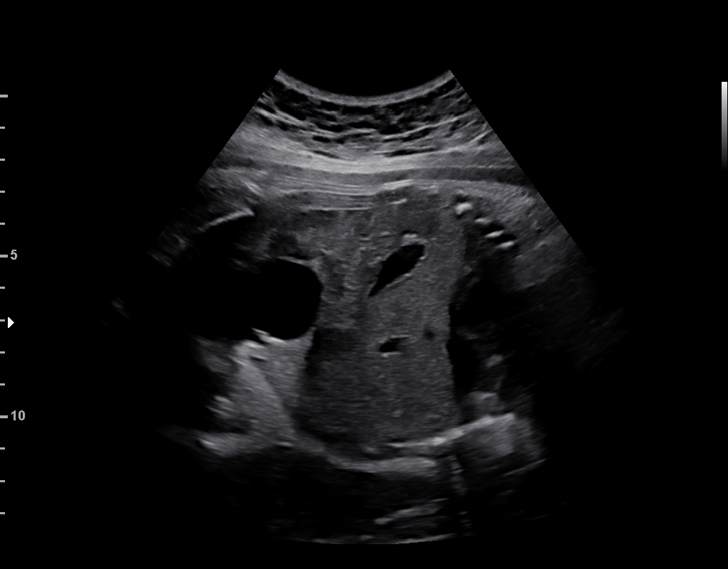
[im 30/43]
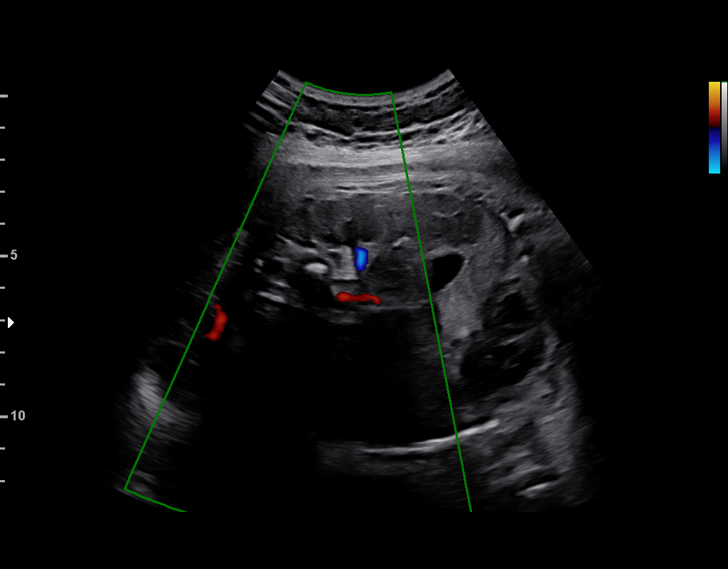
[im 33/43]
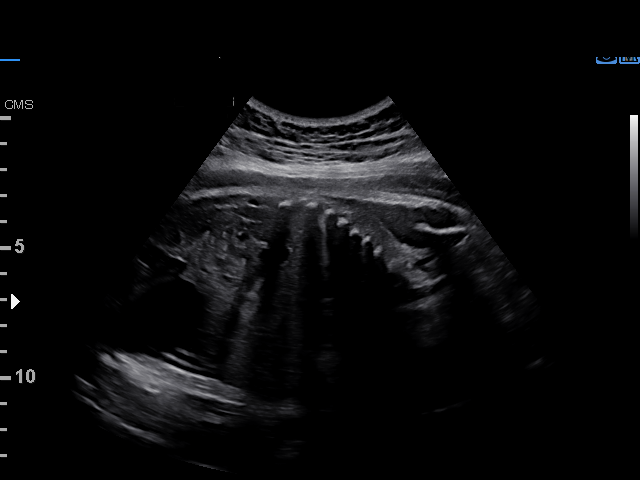
[im 36/43]
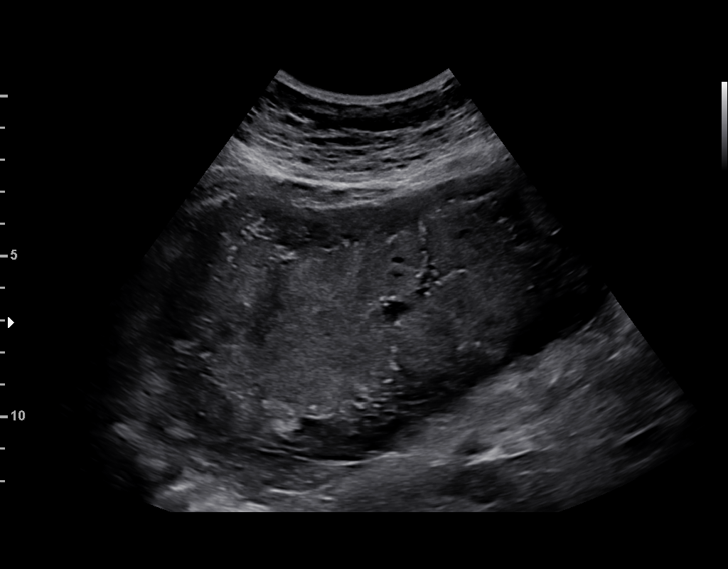
[im 39/43]
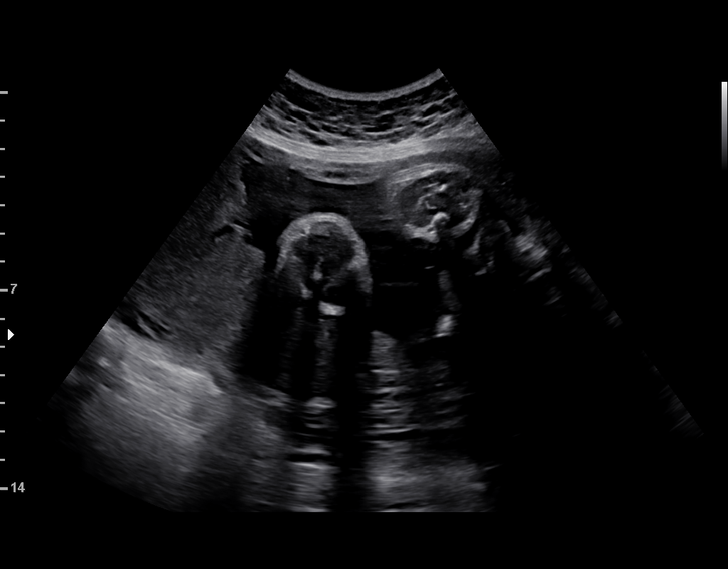
[im 43/43]
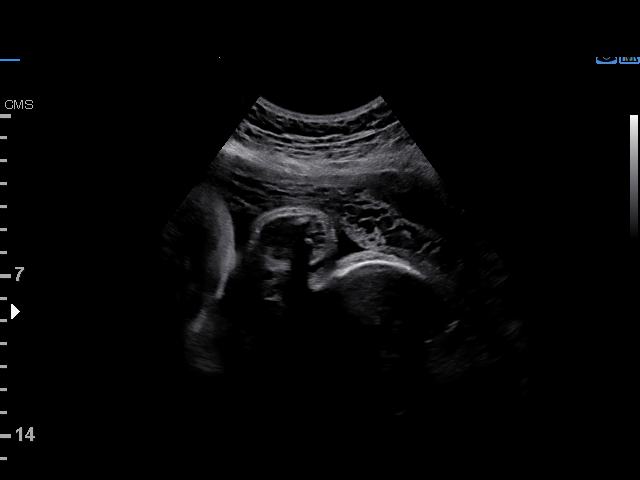

[14 of 28 positions shown; findings below may reference images not displayed]

[facility]

                                                            [address]

 ----------------------------------------------------------------------

 ----------------------------------------------------------------------
Indications

  Encounter for other antenatal screening
  follow-up
  Cholestasis of pregnancy, third trimester      [id number].6
  (Actigall)
  Maternal care for known or suspected poor
  fetal growth, third trimester, not applicable or
  unspecified
  Poor obstetric history: Previous gestational
  diabetes/GHTN
  36 weeks gestation of pregnancy
 ----------------------------------------------------------------------
Vital Signs

                                                Height:        4'11"
Fetal Evaluation

 Num Of Fetuses:         1
 Fetal Heart Rate(bpm):  153
 Cardiac Activity:       Observed
 Presentation:           Cephalic
 Placenta:               Posterior
 P. Cord Insertion:      Previously Visualized

 Amniotic Fluid
 AFI FV:      Within normal limits

 AFI Sum(cm)     %Tile       Largest Pocket(cm)
 6.73            3           3
 RUQ(cm)                     LUQ(cm)        LLQ(cm)
 3
Biophysical Evaluation

 Amniotic F.V:   Within normal limits       F. Tone:        Observed
 F. Movement:    Observed                   Score:          [DATE]
 F. Breathing:   Observed
OB History

 Gravidity:    4         Term:   3        Prem:   0        SAB:   0
 TOP:          0       Ectopic:  0        Living: 3
Gestational Age

 LMP:           37w 3d        Date:  05/26/18                 EDD:   03/02/19
 Best:          36w 3d     Det. By:  Previous Ultrasound      EDD:   03/09/19
                                     (10/09/18)
Doppler - Fetal Vessels

 Umbilical Artery
  S/D     %tile     RI              PI                     ADFV    RDFV
 2.52       59     0.6            0.91                        No      No

Impression

 Known IUGR
 Biophysical profile [DATE]
 UA Dopplers within normal limits
Recommendations

 Continue weeky testing with UA Dopplers

## 2021-01-27 ENCOUNTER — Ambulatory Visit: Payer: Self-pay

## 2021-01-29 ENCOUNTER — Ambulatory Visit: Payer: Self-pay | Admitting: Obstetrics & Gynecology

## 2021-02-09 ENCOUNTER — Ambulatory Visit: Payer: Self-pay | Admitting: Obstetrics and Gynecology

## 2021-02-18 ENCOUNTER — Ambulatory Visit: Payer: Self-pay | Admitting: Family Medicine

## 2021-06-16 ENCOUNTER — Other Ambulatory Visit: Payer: Self-pay

## 2021-06-16 ENCOUNTER — Ambulatory Visit: Payer: Self-pay | Admitting: *Deleted

## 2021-06-16 VITALS — BP 120/74 | Wt 166.2 lb

## 2021-06-16 DIAGNOSIS — R87611 Atypical squamous cells cannot exclude high grade squamous intraepithelial lesion on cytologic smear of cervix (ASC-H): Secondary | ICD-10-CM

## 2021-06-16 DIAGNOSIS — Z1239 Encounter for other screening for malignant neoplasm of breast: Secondary | ICD-10-CM

## 2021-06-16 NOTE — Patient Instructions (Signed)
Explained breast self awareness with Colleen Coleman. Patient did not need a Pap smear today due to last Pap smear was 12/24/2020. Explained the colposcopy the recommended follow-up for her abnormal Pap smear. Referred patient to the [facility] for [facility] for a colposcopy to follow-up for her abnormal Pap smear. Appointment scheduled Wednesday, June 24, 2021 at 0935. Patient aware of appointment and will be there. Let patient know a screening mammogram is recommended at age 39 unless clinically indicated prior. [name] verbalized understanding.  Amoni Morales, Kathaleen Maser, RN 9:40 AM

## 2021-06-16 NOTE — Progress Notes (Signed)
Colleen Coleman is a 30 y.o. female who presents to [facility] clinic today with no complaints. Patient referred to BCCCP by the [facility] due to having an abnormal Pap smear on 09/23/2020 that a colposcopy is recommended for follow-up.   Pap Smear: Pap smear not completed today. Last Pap smear was 12/24/2020 at the [facility] clinic and was abnormal - ASC-H/HSIL/AGC with positive HPV . Per patient has no history of an abnormal Pap smear prior to her most recent Pap smear. Last Pap smear result is available in Epic.   Physical exam: Breasts Breasts symmetrical. No skin abnormalities bilateral breasts. No nipple retraction bilateral breasts. No nipple discharge bilateral breasts. No lymphadenopathy. No lumps palpated bilateral breasts. No complaints of pain or tenderness on exam. Screening mammogram recommended at age 31 unless clinically indicated prior.       Pelvic/Bimanual Pap is not indicated today per BCCCP guidelines.   Smoking History: Patient has never smoked.   Patient Navigation: Patient education provided. Access to services provided for patient through Colleen Coleman program. Spanish interpreter Colleen Coleman from [facility] provided.    Breast and Cervical Cancer Risk Assessment: Patient does not have family history of breast cancer, known genetic mutations, or radiation treatment to the chest before age 49. Patient does not have history of cervical dysplasia, immunocompromised, or DES exposure in-utero. Breast cancer risk assessment completed. No breast cancer risk calculated due to patient is less than 52 years old.  Risk Assessment     Risk Scores       06/16/2021   Last edited by: Colleen Rutherford, LPN   5-year risk:    Lifetime risk:             A: BCCCP exam without pap smear No complaints.  P: Referred patient to the [facility] for [facility] for a colposcopy to follow-up for her abnormal Pap smear.  Appointment scheduled Wednesday, June 24, 2021 at 0935.  Colleen Heidelberg, RN 06/16/2021 9:40 AM

## 2021-06-24 ENCOUNTER — Ambulatory Visit: Payer: Self-pay | Admitting: Obstetrics & Gynecology

## 2021-07-01 ENCOUNTER — Encounter: Payer: Self-pay | Admitting: Family Medicine

## 2021-07-01 ENCOUNTER — Ambulatory Visit (INDEPENDENT_AMBULATORY_CARE_PROVIDER_SITE_OTHER): Payer: Self-pay | Admitting: Family Medicine

## 2021-07-01 ENCOUNTER — Other Ambulatory Visit: Payer: Self-pay

## 2021-07-01 ENCOUNTER — Other Ambulatory Visit (HOSPITAL_COMMUNITY)
Admission: RE | Admit: 2021-07-01 | Discharge: 2021-07-01 | Disposition: A | Discharge status: Home / Self Care | Payer: Self-pay | Source: Ambulatory Visit | Attending: Family Medicine | Admitting: Family Medicine

## 2021-07-01 VITALS — BP 116/70 | HR 80 | Wt 168.5 lb

## 2021-07-01 DIAGNOSIS — R87619 Unspecified abnormal cytological findings in specimens from cervix uteri: Secondary | ICD-10-CM

## 2021-07-01 DIAGNOSIS — D069 Carcinoma in situ of cervix, unspecified: Secondary | ICD-10-CM

## 2021-07-01 DIAGNOSIS — R87611 Atypical squamous cells cannot exclude high grade squamous intraepithelial lesion on cytologic smear of cervix (ASC-H): Secondary | ICD-10-CM | POA: Insufficient documentation

## 2021-07-01 DIAGNOSIS — Z3202 Encounter for pregnancy test, result negative: Secondary | ICD-10-CM

## 2021-07-01 LAB — POCT PREGNANCY, URINE: Preg Test, Ur: NEGATIVE

## 2021-07-01 NOTE — Progress Notes (Signed)
Patient Name: Colleen Coleman, female   DOB: 12-26-1990, 30 y.o.  MRN: [id number]  Colposcopy Procedure Note:  [id number] Pregnancy status: Unknown No results found for: DIAGPAP, HPV, HPVHIGH  Cervical History: Previous Abnormal Pap: ASC-H, AGUS Previous Colposcopy: none Previous LEEP or Cryo: none  Smoking: Never Smoked Hysterectomy: No Other History:   Patient given informed consent, signed copy in the chart, time out was performed.    Exam: Vulva and Vagina grossly normal.  Cervix viewed with speculum and colposcope after application of acetic acid:  Cervix Fully Visualized Squamocolumnar Junction Visibility: Fully visualized  Acetowhite lesions: 6 and 12 o'clock  Other Lesions: None Punctation: Fine  Mosaicism: Not present Abnormal vasculature: No   Biopsies: 6 and 12 o'clock ECC: Yes - Curette and Brush  Hemostasis achieved with:  Monsel's Solution  Colposcopy Impression:  CIN1   Patient was given post procedure instructions.  Will call patient with results.

## 2021-07-01 NOTE — Progress Notes (Signed)
ENDOMETRIAL BIOPSY     The indications for endometrial biopsy were reviewed.   Risks of the biopsy including cramping, bleeding, infection, uterine perforation, inadequate specimen and need for additional procedures  were discussed. The patient states she understands and agrees to undergo procedure today. Consent was signed. Time out was performed. Urine HCG was negative. A sterile speculum was placed in the patient's vagina and the cervix was prepped with Betadine.  The 3 mm pipelle was introduced into the endometrial cavity without difficulty to a depth of 9cm, and a moderate amount of tissue was obtained and sent to pathology. The instruments were removed from the patient's vagina. Minimal bleeding from the cervix was noted. The patient tolerated the procedure well. Routine post-procedure instructions were given to the patient. The patient will follow up to review the results and for further management.

## 2021-07-03 LAB — SURGICAL PATHOLOGY

## 2021-07-15 ENCOUNTER — Encounter: Payer: Self-pay | Admitting: Family Medicine

## 2021-07-15 ENCOUNTER — Ambulatory Visit (INDEPENDENT_AMBULATORY_CARE_PROVIDER_SITE_OTHER): Payer: Self-pay | Admitting: Family Medicine

## 2021-07-15 ENCOUNTER — Other Ambulatory Visit: Payer: Self-pay

## 2021-07-15 DIAGNOSIS — D069 Carcinoma in situ of cervix, unspecified: Secondary | ICD-10-CM

## 2021-07-15 NOTE — Progress Notes (Signed)
GYNECOLOGY OFFICE VISIT NOTE  History:   Colleen Coleman is a 31 y.o. [phone] here today for colposcopy result follow up.  Patient had ASC-H pap in 12/2020, and colposcopy 07/01/21 Pathology resulted CIN 3 Patient with many questions  Health Maintenance Due  Topic Date Due   HEMOGLOBIN A1C  Never done   COVID-19 Vaccine (1) Never done   Pneumococcal Vaccine 90-32 Years old (1 - PCV) Never done   FOOT EXAM  Never done   OPHTHALMOLOGY EXAM  Never done   URINE MICROALBUMIN  Never done   Hepatitis C Screening  Never done   INFLUENZA VACCINE  02/02/2021    Past Medical History:  Diagnosis Date   Cholelithiasis complicating pregnancy in third trimester, antepartum    Cholestasis of pregnancy in third trimester 02/19/2016   Depression    Gestational diabetes    with 3rd pregnancy   Hx of chlamydia infection    Hyperemesis arising during pregnancy    Obesity (BMI 30.0-34.9)    Supervision of high risk pregnancy, antepartum 01/12/2016    Clinic  [facility] Prenatal Labs Dating  Blood type: A/Positive/-- (03/27 0000) A+ Genetic Screen 1 Screen:    AFP:     Quad:     NIPS: Antibody:Negative (03/27 0000) Anatomic [place] At HD nl Rubella: Immune (03/27 0000) GTT Early:               Third trimester:  RPR: Nonreactive (06/19 0000)  Flu vaccine  09/2015) HBsAg: Negative (03/27 0000)  TDaP vaccine     Re eived                                      Varicose veins    back of both legs    Past Surgical History:  Procedure Laterality Date   NO PAST SURGERIES      The following portions of the patient's history were reviewed and updated as appropriate: allergies, current medications, past family history, past medical history, past social history, past surgical history and problem list.   Health Maintenance:   Last pap: No results found for: DIAGPAP, HPV, HPVHIGH 12/24/2020 - ASC-H, +HRHPV  07/01/21 - CIN 3, benign ECC, benign EMB  Last mammogram:  N/a    Review of Systems:  Pertinent  items noted in HPI and remainder of comprehensive ROS otherwise negative.  Physical Exam:  BP 126/83    Pulse 91    Wt 169 lb 3.2 oz (76.7 kg)    Breastfeeding No    BMI 31.97 kg/m  CONSTITUTIONAL: Well-developed, well-nourished female in no acute distress.  HEENT:  Normocephalic, atraumatic. External right and left ear normal. No scleral icterus.  NECK: Normal range of motion, supple, no masses noted on observation SKIN: No rash noted. Not diaphoretic. No erythema. No pallor. MUSCULOSKELETAL: Normal range of motion. No edema noted. NEUROLOGIC: Alert and oriented to person, place, and time. Normal muscle tone coordination.  PSYCHIATRIC: Normal mood and affect. Normal behavior. Normal judgment and thought content. RESPIRATORY: Effort normal, no problems with respiration noted   Labs and Imaging No results found for this or any previous visit (from the past 168 hour(s)). No results found.    Assessment and Plan:   Problem List Items Addressed This Visit       Genitourinary   CIN III (cervical intraepithelial neoplasia grade III) with severe dysplasia    Reviewed pathology showing  CIN 3, with recommendation to proceed to LEEP procedure.   Reviewed risks and benefits of LEEP, including possible progression to frank cervical cancer without the procedure, ~8% risk of cervical stenosis and attendant risks of abnormal labor curve and PPROM, increased rate of 2nd trimester pregnancy loss (increased from 0.4% to 1.6% in one study, with overall RR 2.6), and the risk of preterm birth (increased from 5.4% to 9.5%  in one study with overall RR 1.75).   After reviewing risks and benefits patient elected to proceed with LEEP. Schedule next available.        Routine preventative health maintenance measures emphasized. Please refer to After Visit Summary for other counseling recommendations.   Return for next availalbe for LEEP.    Total face-to-face time with patient: 20 minutes.  Over 50% of  encounter was spent on counseling and coordination of care.   [name], MD/MPH Attending Family Medicine Physician, [facility] for [facility], [facility]

## 2021-07-15 NOTE — Assessment & Plan Note (Signed)
Reviewed pathology showing CIN 3, with recommendation to proceed to LEEP procedure.   Reviewed risks and benefits of LEEP, including possible progression to frank cervical cancer without the procedure, ~8% risk of cervical stenosis and attendant risks of abnormal labor curve and PPROM, increased rate of 2nd trimester pregnancy loss (increased from 0.4% to 1.6% in one study, with overall RR 2.6), and the risk of preterm birth (increased from 5.4% to 9.5%  in one study with overall RR 1.75).   After reviewing risks and benefits patient elected to proceed with LEEP. Schedule next available.

## 2021-07-29 ENCOUNTER — Encounter: Payer: Self-pay | Admitting: Family Medicine

## 2021-07-29 ENCOUNTER — Other Ambulatory Visit: Payer: Self-pay

## 2021-07-29 ENCOUNTER — Other Ambulatory Visit (HOSPITAL_COMMUNITY)
Admission: RE | Admit: 2021-07-29 | Discharge: 2021-07-29 | Disposition: A | Discharge status: Home / Self Care | Payer: Self-pay | Source: Ambulatory Visit | Attending: Family Medicine | Admitting: Family Medicine

## 2021-07-29 ENCOUNTER — Other Ambulatory Visit (HOSPITAL_COMMUNITY): Payer: Self-pay

## 2021-07-29 ENCOUNTER — Telehealth: Payer: Self-pay

## 2021-07-29 ENCOUNTER — Ambulatory Visit (INDEPENDENT_AMBULATORY_CARE_PROVIDER_SITE_OTHER): Payer: Self-pay | Admitting: Family Medicine

## 2021-07-29 VITALS — BP 126/65 | HR 80 | Wt 169.4 lb

## 2021-07-29 DIAGNOSIS — D069 Carcinoma in situ of cervix, unspecified: Secondary | ICD-10-CM | POA: Insufficient documentation

## 2021-07-29 LAB — POCT PREGNANCY, URINE: Preg Test, Ur: NEGATIVE

## 2021-07-29 MED ORDER — OXYCODONE HCL 5 MG PO TABS
5.0000 mg | ORAL_TABLET | ORAL | 0 refills | Status: DC | PRN
  Filled 2021-07-29: qty 5, 1d supply, fill #0

## 2021-07-29 NOTE — Progress Notes (Signed)
° °  GYNECOLOGY OFFICE PROCEDURE NOTE  Colleen Coleman is a 31 y.o. [id number] here for LEEP. No GYN concerns. Pap smear and colposcopy history reviewed.    Pap: ASC-H, AGC, +HPV on 12/24/2020  Colpo Biopsy: CIN III on 07/01/2021  ECC:  no dysplasia or malignancy  Risks, benefits, alternatives, and limitations of procedure explained to patient, including pain, bleeding, infection, failure to remove abnormal tissue and failure to cure dysplasia, need for repeat procedures, damage to pelvic organs, cervical incompetence.  Role of HPV,cervical dysplasia and need for close followup was empasized. Informed written consent was obtained. All questions were answered. Time out performed. Urine pregnancy test was negative.  ??Procedure: The patient was placed in lithotomy position and the bivalved coated speculum was placed in the patient's vagina. A grounding pad placed on the patient. Acetic acid solution was applied to the cervix and a colposcopy was performed with areas of decreased uptake noted around the transformation zone.   Local anesthesia was administered via an intracervical block using 20 ml of 2% Lidocaine with epinephrine. Placement of anesthesia was very difficult for the patient and she had what appeared to be a panic attack before we could complete anesthesia. We coached her through deep breathing and allowed her to feel calm again before proceeding with remainder of lidocaine injections (5 cc at 10, 2, 5, and 7 o clock). The suction was turned on and the Large 1X Fisher [facility] Biopsy Excisor on 50 Watts of blended current was used to excise the area of decreased uptake and excise the entire transformation zone. Unfortunately though I completed this excision very quickly patient reported severe pain. I attempted to achieve hemostasis using roller ball coagulation set at 50 Watts, however patient did not tolerate this. Instead Monsel's solution and prolonged pressure was applied, with excellent  hemostasis achieved. The speculum was removed from the vagina. Specimens were sent to pathology.  Due to significant patient anxiety and pain during the procedure, recommend any future procedures that are needed be done in the OR. Post-operative instructions given to patient, including instruction to seek medical attention for persistent bright red bleeding, fever, abdominal/pelvic pain, dysuria, nausea or vomiting. She was also told about the possibility of having copious yellow to black tinged discharge for weeks. She was counseled to avoid anything in the vagina (sex/douching/tampons) for 3 weeks. She has a 4 week post-operative check to assess wound healing, review results and discuss further management.    [name], MD/MPH Attending Family Medicine Physician, [facility] for [facility], [facility]

## 2021-07-29 NOTE — Telephone Encounter (Signed)
Via, Colleen Coleman, Spanish Interpreter [facility]), Patient was contacted to verify [place] citizenship to determine whether or not she is eligible to do an application for BCCCP Medicaid to help cover upcoming cervical procedure. Patient stated she was not a [place] citizen, [employer] assistance form mailed to patient.

## 2021-07-31 LAB — SURGICAL PATHOLOGY

## 2021-07-31 NOTE — Progress Notes (Signed)
Called patient to let her know that biopsy showed CIN 3, no evidence of cancer, and negative margins.  Discussed need for repeat cotesting in 6 months, timed reminder message for admin staff created.

## 2021-08-25 NOTE — Progress Notes (Signed)
° °  GYNECOLOGY OFFICE VISIT NOTE  History:   Colleen Coleman is a 31 y.o. [phone] here today for postop check following LEEP procedure on 1/25. Pathology CIN3 with negative margins.   She denies any abnormal vaginal discharge, bleeding, pelvic pain or other concerns.  The following portions of the patient's history were reviewed and updated as appropriate: allergies, current medications, past family history, past medical history, past social history, past surgical history and problem list.   Review of Systems:  Pertinent items noted in HPI and remainder of comprehensive ROS otherwise negative.  Physical Exam:  BP 107/74    Pulse 86    Wt 170 lb (77.1 kg)    BMI 32.12 kg/m  CONSTITUTIONAL: Well-developed, well-nourished female in no acute distress.  HEENT:  Normocephalic, atraumatic. External right and left ear normal. No scleral icterus.  NECK: Normal range of motion, supple, no masses noted on observation SKIN: No rash noted. Not diaphoretic. No erythema. No pallor. MUSCULOSKELETAL: Normal range of motion. No edema noted. NEUROLOGIC: Alert and oriented to person, place, and time. Normal muscle tone coordination. No cranial nerve deficit noted. PSYCHIATRIC: Normal mood and affect. Normal behavior. Normal judgment and thought content.  CARDIOVASCULAR: Normal heart rate noted RESPIRATORY: Effort and breath sounds normal, no problems with respiration noted ABDOMEN: No masses noted. No other overt distention noted.    PELVIC: Normal appearing external genitalia; normal urethral meatus; normal appearing vaginal mucosa and cervix.  No abnormal discharge noted.  Normal uterine size, no other palpable masses, no uterine or adnexal tenderness. Performed in the presence of a chaperone  Labs and Imaging No results found for this or any previous visit (from the past 168 hour(s)). No results found.  Assessment and Plan:   1. CIN III (cervical intraepithelial neoplasia grade III) with severe  dysplasia - LEEP bed well healed - Reviewed gardasil - She is doing [facility] application and would like to get it if discounted.  - Discussed condom use for next 6 months to reduce transmission.  - Reviewed restart of nuvaring  Routine preventative health maintenance measures emphasized. Please refer to After Visit Summary for other counseling recommendations.   Return in about 6 months (around 02/23/2022) for pap - BCCCP pt.  Radene Gunning, MD, [place] [facility] for [facility], [place]

## 2021-08-26 ENCOUNTER — Ambulatory Visit (INDEPENDENT_AMBULATORY_CARE_PROVIDER_SITE_OTHER): Payer: Self-pay | Admitting: Obstetrics and Gynecology

## 2021-08-26 ENCOUNTER — Encounter: Payer: Self-pay | Admitting: Obstetrics and Gynecology

## 2021-08-26 ENCOUNTER — Other Ambulatory Visit: Payer: Self-pay

## 2021-08-26 VITALS — BP 107/74 | HR 86 | Wt 170.0 lb

## 2021-08-26 DIAGNOSIS — D069 Carcinoma in situ of cervix, unspecified: Secondary | ICD-10-CM

## 2022-01-27 ENCOUNTER — Ambulatory Visit: Payer: Self-pay | Admitting: Family Medicine

## 2022-02-01 ENCOUNTER — Ambulatory Visit: Payer: Self-pay | Admitting: Obstetrics and Gynecology

## 2022-02-23 ENCOUNTER — Ambulatory Visit: Payer: Self-pay

## 2022-04-29 ENCOUNTER — Other Ambulatory Visit: Payer: Self-pay | Admitting: *Deleted

## 2022-04-29 DIAGNOSIS — Z124 Encounter for screening for malignant neoplasm of cervix: Secondary | ICD-10-CM

## 2022-04-29 NOTE — Progress Notes (Signed)
Patient: Colleen Coleman           Date of Birth: 1990-12-28           MRN: [id number] Visit Date: 04/29/2022 PCP: Patient, No Pcp Per  Cervical Cancer Screening Do you smoke?: No Have you ever had or been told you have an allergy to latex products?: No Marital status: Married Date of last pap smear: 1-2 yrs ago (12/24/20 (ascus-h, +hpv)) Date of last menstrual period: 04/24/22 Number of pregnancies: 4 Number of births: 4 Have you ever had any of the following? Hysterectomy: No Tubal ligation (tubes tied): No Abnormal bleeding: No Abnormal pap smear: Yes (12/24/20 (ASCUS-H, +HPV)) Venereal warts: No A sex partner with venereal warts: No A high risk* sex partner: No  Cervical Exam  Abnormal Observations: Normal Exam. Recommendations: Last Pap smear was 12/24/2020 at the [facility] clinic and was abnormal - ASC-H/HSIL/AGC with positive HPV. Patient had a colposcopy completed 07/01/2021 that showed CIN III and LEEP 07/29/2021 that showed CIN II for follow up. Per patient has no history of an abnormal Pap smear prior to her most recent Pap smear. Last Pap smear result is available in Epic. Let patient know that her next Pap smear will be due based on the result of today's Pap smear and will copy Dr. Crissie Coleman on the result. Informed patient will call within the next couple of weeks with results of Pap smear by phone. Patient verbalized understanding.    Spanish Interpreter Colleen Coleman from [facility] provided.  Patient's History Patient Active Problem List   Diagnosis Date Noted   CIN III (cervical intraepithelial neoplasia grade III) with severe dysplasia 07/15/2021   History of cholestasis during pregnancy 02/18/2019   History of gestational diabetes 10/05/2018   History of gestational hypertension 10/05/2018   Past Medical History:  Diagnosis Date   Cholelithiasis complicating pregnancy in third trimester, antepartum    Cholestasis of pregnancy in third  trimester 02/19/2016   Depression    Gestational diabetes    with 3rd pregnancy   Hx of chlamydia infection    Hyperemesis arising during pregnancy    Obesity (BMI 30.0-34.9)    Supervision of high risk pregnancy, antepartum 01/12/2016    Clinic  [facility] Prenatal Labs Dating  Blood type: A/Positive/-- (03/27 0000) A+ Genetic Screen 1 Screen:    AFP:     Quad:     NIPS: Antibody:Negative (03/27 0000) Anatomic [place] At HD nl Rubella: Immune (03/27 0000) GTT Early:               Third trimester:  RPR: Nonreactive (06/19 0000)  Flu vaccine  09/2015) HBsAg: Negative (03/27 0000)  TDaP vaccine     Re eived                                      Varicose veins    back of both legs    Family History  Problem Relation Age of Onset   Other Mother        varicosities   Hypertension Sister    Other Sister        varicosities   Depression Sister     Social History   Occupational History   Not on file  Tobacco Use   Smoking status: Never   Smokeless tobacco: Never  Vaping Use   Vaping Use: Never used  Substance and Sexual Activity  Alcohol use: No   Drug use: No   Sexual activity: Yes    Birth control/protection: Condom, Inserts    Comment: Nuvaring

## 2022-05-05 LAB — CYTOLOGY - PAP
Comment: NEGATIVE
Diagnosis: UNDETERMINED — AB
High risk HPV: NEGATIVE

## 2022-05-06 ENCOUNTER — Telehealth: Payer: Self-pay

## 2022-05-06 NOTE — Telephone Encounter (Signed)
Called patient via Colleen Coleman, [employer] to give pap smear results. Informed patient that pap smear was ASCUS and HPV was negative. Based on this result and her previous hx her next pap smear will be due in 1 years. Patient voiced understanding.

## 2022-05-06 NOTE — Progress Notes (Signed)
Hello Dr. Dione Plover. Can you please provide your recommendation for when this patient's next pap should be done? You saw her last for her LEEP. Thanks!

## 2024-04-20 ENCOUNTER — Other Ambulatory Visit: Payer: Self-pay

## 2024-04-20 ENCOUNTER — Other Ambulatory Visit (HOSPITAL_COMMUNITY): Payer: Self-pay

## 2024-04-20 ENCOUNTER — Emergency Department (HOSPITAL_COMMUNITY): Payer: Self-pay

## 2024-04-20 ENCOUNTER — Emergency Department (HOSPITAL_COMMUNITY)
Admission: EM | Admit: 2024-04-20 | Discharge: 2024-04-20 | Disposition: A | Discharge status: Home / Self Care | Payer: Self-pay | Attending: Emergency Medicine | Admitting: Emergency Medicine

## 2024-04-20 DIAGNOSIS — R072 Precordial pain: Secondary | ICD-10-CM | POA: Insufficient documentation

## 2024-04-20 DIAGNOSIS — R1013 Epigastric pain: Secondary | ICD-10-CM | POA: Insufficient documentation

## 2024-04-20 LAB — URINALYSIS, ROUTINE W REFLEX MICROSCOPIC
Bilirubin Urine: NEGATIVE
Glucose, UA: NEGATIVE mg/dL
Ketones, ur: NEGATIVE mg/dL
Leukocytes,Ua: NEGATIVE
Nitrite: NEGATIVE
Protein, ur: NEGATIVE mg/dL
Specific Gravity, Urine: 1.016 (ref 1.005–1.030)
pH: 5 (ref 5.0–8.0)

## 2024-04-20 LAB — CBC
HCT: 39.7 % (ref 36.0–46.0)
Hemoglobin: 13.2 g/dL (ref 12.0–15.0)
MCH: 29.4 pg (ref 26.0–34.0)
MCHC: 33.2 g/dL (ref 30.0–36.0)
MCV: 88.4 fL (ref 80.0–100.0)
Platelets: 336 K/uL (ref 150–400)
RBC: 4.49 MIL/uL (ref 3.87–5.11)
RDW: 13 % (ref 11.5–15.5)
WBC: 10.4 K/uL (ref 4.0–10.5)
nRBC: 0 % (ref 0.0–0.2)

## 2024-04-20 LAB — COMPREHENSIVE METABOLIC PANEL WITH GFR
ALT: 30 U/L (ref 0–44)
AST: 26 U/L (ref 15–41)
Albumin: 3.7 g/dL (ref 3.5–5.0)
Alkaline Phosphatase: 79 U/L (ref 38–126)
Anion gap: 11 (ref 5–15)
BUN: 10 mg/dL (ref 6–20)
CO2: 23 mmol/L (ref 22–32)
Calcium: 8.7 mg/dL — ABNORMAL LOW (ref 8.9–10.3)
Chloride: 101 mmol/L (ref 98–111)
Creatinine, Ser: 0.69 mg/dL (ref 0.44–1.00)
GFR, Estimated: >60 mL/min (ref >60)
Glucose, Bld: 115 mg/dL — ABNORMAL HIGH (ref 70–99)
Potassium: 4 mmol/L (ref 3.5–5.1)
Sodium: 135 mmol/L (ref 135–145)
Total Bilirubin: 0.3 mg/dL (ref 0.0–1.2)
Total Protein: 7.2 g/dL (ref 6.5–8.1)

## 2024-04-20 LAB — HCG, SERUM, QUALITATIVE: Preg, Serum: NEGATIVE

## 2024-04-20 LAB — TROPONIN I (HIGH SENSITIVITY)
Troponin I (High Sensitivity): 8 ng/L (ref <18)
Troponin I (High Sensitivity): 7 ng/L (ref <18)

## 2024-04-20 LAB — LIPASE, BLOOD: Lipase: 38 U/L (ref 11–51)

## 2024-04-20 MED ORDER — ALUM & MAG HYDROXIDE-SIMETH 200-200-20 MG/5ML PO SUSP
30.0000 mL | Freq: Once | ORAL | Status: AC
  Administered 2024-04-20: 30 mL via ORAL
  Filled 2024-04-20: qty 30

## 2024-04-20 MED ORDER — LIDOCAINE VISCOUS HCL 2 % MT SOLN
15.0000 mL | Freq: Once | OROMUCOSAL | Status: AC
  Administered 2024-04-20: 15 mL via ORAL
  Filled 2024-04-20: qty 15

## 2024-04-20 MED ORDER — PANTOPRAZOLE SODIUM 20 MG PO TBEC
20.0000 mg | DELAYED_RELEASE_TABLET | Freq: Every day | ORAL | 0 refills | Status: DC
  Filled 2024-04-20: qty 15, 15d supply, fill #0

## 2024-04-20 MED ORDER — OXYCODONE-ACETAMINOPHEN 5-325 MG PO TABS
1.0000 | ORAL_TABLET | Freq: Once | ORAL | Status: AC
  Administered 2024-04-20: 1 via ORAL
  Filled 2024-04-20: qty 1

## 2024-04-20 NOTE — Discharge Instructions (Addendum)
 Today you were seen for epigastric pain.  I suspect this is likely due to acid reflux.  You have been prescribed a short course of Protonix to take daily for acid reflux.  Please follow-up with your primary care provider for further evaluation and workup.  I thank you for letting us  treat you today. After reviewing your labs and imaging, I feel you are safe to go home. Please follow up with your PCP in the next several days and provide them with your records from this visit. Return to the Emergency Room if pain becomes severe or symptoms worsen.

## 2024-04-20 NOTE — ED Provider Notes (Signed)
 [facility] EMERGENCY DEPARTMENT AT [facility] Provider Note   CSN: [id number] Arrival date & time: 04/20/24  9893     Patient presents with: Chest Pain and Abdominal Pain   Colleen Coleman is a 33 y.o. female presents today for epigastric/substernal chest pain that began earlier this evening with associated emesis.  Patient denies shortness of breath, numbness, weakness, diarrhea, fever, chills, hematemesis, or any other complaints at this time.    Chest Pain Associated symptoms: abdominal pain and vomiting   Abdominal Pain Associated symptoms: chest pain and vomiting        Prior to Admission medications   Medication Sig Start Date End Date Taking? Authorizing Provider  pantoprazole (PROTONIX) 20 MG tablet Take 1 tablet (20 mg total) by mouth daily for 15 days. 04/20/24 05/05/24 Yes Jennie Bolar N, PA-C  acetaminophen  (TYLENOL ) 325 MG tablet Take 325 mg by mouth every 6 (six) hours as needed (pain). Reported on 01/15/2016 Patient not taking: Reported on 07/29/2021    [provider]  cyclobenzaprine  (FLEXERIL ) 10 MG tablet Take 1 tablet (10 mg total) by mouth 3 (three) times daily as needed for muscle spasms. Patient not taking: Reported on 07/15/2021 10/05/18   Stinson, Jacob J, DO  etonogestrel-ethinyl estradiol (NUVARING) 0.12-0.015 MG/24HR vaginal ring Place 1 each vaginally every 28 (twenty-eight) days. Insert vaginally and leave in place for 3 consecutive weeks, then remove for 1 week.    [provider]  norethindrone  (MICRONOR ) 0.35 MG tablet Take 1 tablet (0.35 mg total) by mouth daily. Patient not taking: Reported on 07/01/2021 03/20/19   Sid Veva CROME, NP  oxyCODONE  (ROXICODONE ) 5 MG immediate release tablet Take 1 tablet (5 mg total) by mouth every 4 (four) hours as needed for severe pain. Patient not taking: Reported on 08/26/2021 07/29/21   Lola Donnice HERO, MD  Prenatal Vit-Fe Fumarate-FA (PRENATAL MULTIVITAMIN) TABS tablet Take 1  tablet by mouth daily at 12 noon. Patient not taking: Reported on 07/15/2021    [provider]    Allergies: Penicillins and Shrimp [shellfish allergy]    Review of Systems  Cardiovascular:  Positive for chest pain.  Gastrointestinal:  Positive for abdominal pain and vomiting.    Updated Vital Signs BP 99/69   Pulse 68   Temp 98 F (36.7 C) (Oral)   Resp 15   SpO2 100%   Physical Exam Vitals and nursing note reviewed.  Constitutional:      General: She is not in acute distress.    Appearance: She is well-developed. She is not toxic-appearing.  HENT:     Head: Normocephalic and atraumatic.  Eyes:     Conjunctiva/sclera: Conjunctivae normal.  Cardiovascular:     Rate and Rhythm: Normal rate and regular rhythm.     Heart sounds: Normal heart sounds. No murmur heard. Pulmonary:     Effort: Pulmonary effort is normal. No respiratory distress.     Breath sounds: Normal breath sounds.  Abdominal:     General: There is no distension.     Palpations: Abdomen is soft.     Tenderness: There is no abdominal tenderness.  Musculoskeletal:        General: No swelling. Normal range of motion.     Cervical back: Neck supple.  Skin:    General: Skin is warm and dry.     Capillary Refill: Capillary refill takes less than 2 seconds.  Neurological:     Mental Status: She is alert.  Psychiatric:  Mood and Affect: Mood normal.     (all labs ordered are listed, but only abnormal results are displayed) Labs Reviewed  COMPREHENSIVE METABOLIC PANEL WITH GFR - Abnormal; Notable for the following components:      Result Value   Glucose, Bld 115 (*)    Calcium 8.7 (*)    All other components within normal limits  URINALYSIS, ROUTINE W REFLEX MICROSCOPIC - Abnormal; Notable for the following components:   APPearance HAZY (*)    Hgb urine dipstick SMALL (*)    Bacteria, UA RARE (*)    All other components within normal limits  LIPASE, BLOOD  CBC  HCG, SERUM,  QUALITATIVE  TROPONIN I (HIGH SENSITIVITY)  TROPONIN I (HIGH SENSITIVITY)    EKG: None  Radiology: DG Chest 2 View Result Date: 04/20/2024 EXAM: 2 VIEW(S) XRAY OF THE CHEST 04/20/2024 01:26:00 AM COMPARISON: None available. CLINICAL HISTORY: chest pain. Table formatting from the original note was not included.; Images from the original note were not included.; Patient reports epigastric /substernal pain with emesis this evening , denies SOB , no diaphoresis . FINDINGS: LUNGS AND PLEURA: No focal pulmonary opacity. No pulmonary edema. No pleural effusion. No pneumothorax. HEART AND MEDIASTINUM: No acute abnormality of the cardiac and mediastinal silhouettes. BONES AND SOFT TISSUES: No acute osseous abnormality. IMPRESSION: 1. No acute cardiopulmonary process. Electronically signed by: Pinkie Pebbles MD 04/20/2024 01:30 AM EDT RP Workstation: [id number]     Procedures   Medications Ordered in the ED  alum & mag hydroxide-simeth (MAALOX/MYLANTA) 200-200-20 MG/5ML suspension 30 mL (has no administration in time range)    And  lidocaine  (XYLOCAINE ) 2 % viscous mouth solution 15 mL (has no administration in time range)  oxyCODONE -acetaminophen  (PERCOCET/ROXICET) 5-325 MG per tablet 1 tablet (1 tablet Oral Given 04/20/24 0118)                                    Medical Decision Making Amount and/or Complexity of Data Reviewed Labs: ordered. Radiology: ordered.  Risk Prescription drug management.   This patient presents to the ED for concern of epigastric/chest pain with vomiting, this involves an extensive number of treatment options, and is a complaint that carries with it a high risk of complications and morbidity.  The differential diagnosis includes GERD, viral GI illness, STEMI, NSTEMI, arrhythmia, pancreatitis, choledocholithiasis, acute cholecystitis, anemia, electrolyte abnormality    Lab Tests:  I Ordered, and personally interpreted labs.  The pertinent results include:  Elevated glucose at 115, mildly reduced calcium at 8.7, lipase 38, troponin 8, 7, CBC unremarkable, pregnancy negative, urine with small hemoglobin and rare bacteria   Imaging Studies ordered:  I ordered imaging studies including chest x-ray I independently visualized and interpreted imaging which showed no acute cardiopulmonary process I agree with the radiologist interpretation   Cardiac Monitoring: / EKG:  The patient was maintained on a cardiac monitor.  I personally viewed and interpreted the cardiac monitored which showed an underlying rhythm of: Sinus rhythm with PVCs   Problem List / ED Course / Critical interventions / Medication management I ordered medication including GI cocktail I have reviewed the patients home medicines and have made adjustments as needed   Test / Admission - Considered:  Considered for admission or further workup however patient's vital signs, physical exam, labs, and imaging are reassuring.  Patient's symptoms likely due to acid reflux.  Patient given short course of Protonix outpatient and  advised to follow-up with her primary care if her symptoms persist.  I feel patient is safe for discharge at this time.     Final diagnoses:  Epigastric pain    ED Discharge Orders          Ordered    pantoprazole (PROTONIX) 20 MG tablet  Daily        04/20/24 0448               Colleen Ileana SAILOR, PA-C 04/20/24 0451    Theadore Ozell HERO, MD 04/20/24 [phone]

## 2024-04-20 NOTE — ED Notes (Signed)
Patient verbalizes understanding of discharge instructions. Opportunity for questioning and answers were provided. Armband removed by staff, pt discharged from ED. Ambulated out to lobby with husband

## 2024-04-20 NOTE — ED Triage Notes (Signed)
 Patient reports epigastric /substernal pain with emesis this evening , denies SOB , no diaphoresis .

## 2024-05-04 ENCOUNTER — Encounter (HOSPITAL_COMMUNITY): Payer: Self-pay

## 2024-05-04 ENCOUNTER — Emergency Department (HOSPITAL_COMMUNITY): Payer: Self-pay

## 2024-05-04 ENCOUNTER — Other Ambulatory Visit: Payer: Self-pay

## 2024-05-04 ENCOUNTER — Emergency Department (HOSPITAL_COMMUNITY)
Admission: EM | Admit: 2024-05-04 | Discharge: 2024-05-05 | Disposition: A | Discharge status: Home / Self Care | Payer: Self-pay

## 2024-05-04 DIAGNOSIS — R1011 Right upper quadrant pain: Secondary | ICD-10-CM | POA: Insufficient documentation

## 2024-05-04 DIAGNOSIS — K8020 Calculus of gallbladder without cholecystitis without obstruction: Secondary | ICD-10-CM

## 2024-05-04 DIAGNOSIS — R1013 Epigastric pain: Secondary | ICD-10-CM | POA: Insufficient documentation

## 2024-05-04 LAB — COMPREHENSIVE METABOLIC PANEL WITH GFR
ALT: 99 U/L — ABNORMAL HIGH (ref 0–44)
AST: 132 U/L — ABNORMAL HIGH (ref 15–41)
Albumin: 3.7 g/dL (ref 3.5–5.0)
Alkaline Phosphatase: 95 U/L (ref 38–126)
Anion gap: 10 (ref 5–15)
BUN: 9 mg/dL (ref 6–20)
CO2: 25 mmol/L (ref 22–32)
Calcium: 8.9 mg/dL (ref 8.9–10.3)
Chloride: 101 mmol/L (ref 98–111)
Creatinine, Ser: 0.6 mg/dL (ref 0.44–1.00)
GFR, Estimated: >60 mL/min (ref >60)
Glucose, Bld: 104 mg/dL — ABNORMAL HIGH (ref 70–99)
Potassium: 3.8 mmol/L (ref 3.5–5.1)
Sodium: 136 mmol/L (ref 135–145)
Total Bilirubin: 0.7 mg/dL (ref 0.0–1.2)
Total Protein: 7.4 g/dL (ref 6.5–8.1)

## 2024-05-04 LAB — URINALYSIS, ROUTINE W REFLEX MICROSCOPIC
Bilirubin Urine: NEGATIVE
Glucose, UA: NEGATIVE mg/dL
Hgb urine dipstick: NEGATIVE
Ketones, ur: NEGATIVE mg/dL
Leukocytes,Ua: NEGATIVE
Nitrite: NEGATIVE
Protein, ur: NEGATIVE mg/dL
Specific Gravity, Urine: 1.018 (ref 1.005–1.030)
pH: 8 (ref 5.0–8.0)

## 2024-05-04 LAB — LIPASE, BLOOD: Lipase: 34 U/L (ref 11–51)

## 2024-05-04 LAB — CBC
HCT: 38.8 % (ref 36.0–46.0)
Hemoglobin: 13 g/dL (ref 12.0–15.0)
MCH: 29.5 pg (ref 26.0–34.0)
MCHC: 33.5 g/dL (ref 30.0–36.0)
MCV: 88 fL (ref 80.0–100.0)
Platelets: 301 K/uL (ref 150–400)
RBC: 4.41 MIL/uL (ref 3.87–5.11)
RDW: 13 % (ref 11.5–15.5)
WBC: 9.8 K/uL (ref 4.0–10.5)
nRBC: 0 % (ref 0.0–0.2)

## 2024-05-04 LAB — HCG, SERUM, QUALITATIVE: Preg, Serum: NEGATIVE

## 2024-05-04 MED ORDER — FAMOTIDINE 20 MG PO TABS
40.0000 mg | ORAL_TABLET | Freq: Once | ORAL | Status: AC
  Administered 2024-05-04: 40 mg via ORAL
  Filled 2024-05-04: qty 2

## 2024-05-04 MED ORDER — IOHEXOL 350 MG/ML SOLN
75.0000 mL | Freq: Once | INTRAVENOUS | Status: AC | PRN
  Administered 2024-05-04: 75 mL via INTRAVENOUS

## 2024-05-04 MED ORDER — ALUM & MAG HYDROXIDE-SIMETH 200-200-20 MG/5ML PO SUSP
30.0000 mL | Freq: Once | ORAL | Status: AC
  Administered 2024-05-04: 30 mL via ORAL
  Filled 2024-05-04: qty 30

## 2024-05-04 MED ORDER — OXYCODONE-ACETAMINOPHEN 5-325 MG PO TABS
1.0000 | ORAL_TABLET | Freq: Four times a day (QID) | ORAL | 0 refills | Status: AC | PRN
  Filled 2024-05-04 – 2024-05-05 (×3): qty 10, 3d supply, fill #0

## 2024-05-04 MED ORDER — PANTOPRAZOLE SODIUM 20 MG PO TBEC
20.0000 mg | DELAYED_RELEASE_TABLET | Freq: Every day | ORAL | 0 refills | Status: AC
  Filled 2024-05-04: qty 30, 30d supply, fill #0

## 2024-05-04 MED ORDER — LACTATED RINGERS IV BOLUS
1000.0000 mL | Freq: Once | INTRAVENOUS | Status: AC
  Administered 2024-05-04: 1000 mL via INTRAVENOUS

## 2024-05-04 MED ORDER — ONDANSETRON 4 MG PO TBDP
4.0000 mg | ORAL_TABLET | Freq: Three times a day (TID) | ORAL | 0 refills | Status: AC | PRN
  Filled 2024-05-04 – 2024-05-05 (×4): qty 20, 7d supply, fill #0

## 2024-05-04 MED ORDER — ONDANSETRON HCL 4 MG/2ML IJ SOLN
4.0000 mg | Freq: Once | INTRAMUSCULAR | Status: AC
  Administered 2024-05-04: 4 mg via INTRAVENOUS
  Filled 2024-05-04: qty 2

## 2024-05-04 MED ORDER — OXYCODONE-ACETAMINOPHEN 5-325 MG PO TABS
1.0000 | ORAL_TABLET | Freq: Once | ORAL | Status: AC
  Administered 2024-05-04: 1 via ORAL
  Filled 2024-05-04: qty 1

## 2024-05-04 MED ORDER — SUCRALFATE 1 GM/10ML PO SUSP
1.0000 g | Freq: Three times a day (TID) | ORAL | 0 refills | Status: AC
  Filled 2024-05-04 – 2024-05-05 (×3): qty 420, 11d supply, fill #0

## 2024-05-04 NOTE — ED Triage Notes (Signed)
 Pt reports upper abd pain x2 weeks. Seen recently for same but no relief with prescribed meds.

## 2024-05-04 NOTE — Discharge Instructions (Addendum)
 Please schedule a follow-up appointment with the GI doctor at the number provided.  Take the Carafate in addition to the Protonix that was prescribed during your last visit.  Please take the Carafate 4 times daily with meals and at bedtime until you follow-up with the GI doctor.  If you are still having pain is okay to take the Percocet.  Do not drive or drink alcohol taking this as it may make you drowsy.  Return to the ER for fevers or worsening symptoms or if you develop any pain in the right upper part of your abdomen as we discussed.  Please follow-up with your primary care doctor to have an ultrasound of your uterus performed as an outpatient.

## 2024-05-04 NOTE — ED Provider Notes (Signed)
 [place] EMERGENCY DEPARTMENT AT [facility] Provider Note   CSN: [id number] Arrival date & time: 05/04/24  8158     Patient presents with: Abdominal Pain   taleigha pinson is a 33 y.o. female.  33 year old female presents ED with complaints of epigastric pain radiating to her right quadrant and back.  Symptoms began yesterday.  Patient was seen in the ED previously for similar symptoms and given pantoprazole prescription which resolved pain until yesterday.  Patient advises she was driving when she had severe pain in her epigastric area.  Patient advises she was not eating or drinking during the incident.  Patient reports this sensation of belching which does relieve the pain she has had 1 episode of vomiting today without any blood noted.  Last food patient was able to eat was around 6 AM and then she reported vomiting approximately at noon.  Currently patient reports the pain is not as severe as it was before but is still persistent.     Prior to Admission medications   Medication Sig Start Date End Date Taking? Authorizing Provider  acetaminophen  (TYLENOL ) 325 MG tablet Take 325 mg by mouth every 6 (six) hours as needed (pain). Reported on 01/15/2016 Patient not taking: Reported on 07/29/2021    [provider]  cyclobenzaprine  (FLEXERIL ) 10 MG tablet Take 1 tablet (10 mg total) by mouth 3 (three) times daily as needed for muscle spasms. Patient not taking: Reported on 07/15/2021 10/05/18   Stinson, Jacob J, DO  etonogestrel-ethinyl estradiol (NUVARING) 0.12-0.015 MG/24HR vaginal ring Place 1 each vaginally every 28 (twenty-eight) days. Insert vaginally and leave in place for 3 consecutive weeks, then remove for 1 week.    [provider]  norethindrone  (MICRONOR ) 0.35 MG tablet Take 1 tablet (0.35 mg total) by mouth daily. Patient not taking: Reported on 07/01/2021 03/20/19   Sid Veva CROME, NP  oxyCODONE  (ROXICODONE ) 5 MG immediate release tablet Take  1 tablet (5 mg total) by mouth every 4 (four) hours as needed for severe pain. Patient not taking: Reported on 08/26/2021 07/29/21   Lola Donnice HERO, MD  pantoprazole (PROTONIX) 20 MG tablet Take 1 tablet (20 mg total) by mouth daily for 15 days. 04/20/24 05/05/24  Francis Ileana SAILOR, PA-C  Prenatal Vit-Fe Fumarate-FA (PRENATAL MULTIVITAMIN) TABS tablet Take 1 tablet by mouth daily at 12 noon. Patient not taking: Reported on 07/15/2021    [provider]    Allergies: Penicillins and Shrimp [shellfish allergy]    Review of Systems  Gastrointestinal:  Positive for abdominal pain, nausea and vomiting.  All other systems reviewed and are negative.   Updated Vital Signs BP 116/60   Pulse 80   Temp 99.1 F (37.3 C)   Resp 19   Ht 5' 1 (1.549 m)   Wt 80.7 kg   LMP 04/23/2024 (Approximate)   SpO2 98%   BMI 33.63 kg/m   Physical Exam Vitals and nursing note reviewed.  Constitutional:      Appearance: Normal appearance. She is well-developed.  HENT:     Head: Normocephalic and atraumatic.     Nose: Nose normal.  Eyes:     General: No scleral icterus.    Extraocular Movements: Extraocular movements intact.     Conjunctiva/sclera: Conjunctivae normal.     Pupils: Pupils are equal, round, and reactive to light.  Neck:     Comments: No pain to palpation down spine. Cardiovascular:     Rate and Rhythm: Normal rate.  Pulmonary:  Effort: Pulmonary effort is normal. No respiratory distress.     Breath sounds: Normal breath sounds.  Abdominal:     General: Abdomen is flat. Bowel sounds are normal. There is no distension.     Palpations: Abdomen is soft.     Tenderness: There is abdominal tenderness in the epigastric area. There is no right CVA tenderness, left CVA tenderness or guarding. Negative signs include Murphy's sign and McBurney's sign.  Musculoskeletal:        General: Normal range of motion.     Cervical back: Normal range of motion.     Comments: No CVA  tenderness noted.  Skin:    General: Skin is warm.     Capillary Refill: Capillary refill takes less than 2 seconds.  Neurological:     General: No focal deficit present.     Mental Status: She is alert.  Psychiatric:        Mood and Affect: Mood normal.        Behavior: Behavior normal.     (all labs ordered are listed, but only abnormal results are displayed) Labs Reviewed  COMPREHENSIVE METABOLIC PANEL WITH GFR - Abnormal; Notable for the following components:      Result Value   Glucose, Bld 104 (*)    AST 132 (*)    ALT 99 (*)    All other components within normal limits  URINALYSIS, ROUTINE W REFLEX MICROSCOPIC - Abnormal; Notable for the following components:   APPearance CLOUDY (*)    All other components within normal limits  LIPASE, BLOOD  CBC  HCG, SERUM, QUALITATIVE    EKG: None  Radiology: No results found.  Procedures   Medications Ordered in the ED  alum & mag hydroxide-simeth (MAALOX/MYLANTA) 200-200-20 MG/5ML suspension 30 mL (30 mLs Oral Given 05/04/24 2103)  oxyCODONE -acetaminophen  (PERCOCET/ROXICET) 5-325 MG per tablet 1 tablet (1 tablet Oral Given 05/04/24 2103)  famotidine (PEPCID) tablet 40 mg (40 mg Oral Given 05/04/24 2103)   33 y.o. female presents to the ED with complaints of epigastric pain radiating to her right quadrant and back., this involves an extensive number of treatment options, and is a complaint that carries with it a high risk of complications and morbidity.  The differential diagnosis includes GERD, colitis, appendicitis, pancreatitis, cholelithiasis/cholecystitis (Ddx)  On arrival pt is nontoxic, vitals unremarkable. Exam significant for epigastric pain radiating to her right upper quadrant.  I ordered medication GI cocktail, Pepcid, oxycodone  for pain and GERD symptoms.  Lab Tests:  I Ordered, reviewed, and interpreted labs, which included: CMP, CBC, hCG, UA, lipase.  Only remarkable findings were elevated AST and  ALT.  Imaging Studies ordered:  I ordered imaging studies which included CT abdomen pelvis with contrast, pending CT scan at shift change.   ED Course:   Patient sitting comfortable in ED bed in no acute distress nontoxic-appearing.  There is no pain to palpation in the right upper quadrant with negative Murphy's.   Patient does have reported pain that radiates from her epigastric to her right upper quadrant.  Initial plan is to treat GERD like symptoms and get CT scan for further evaluation.  After administration of GI cocktail Pepcid and oxycodone  patient reported feeling much better but she does feel like she has an emptiness in her stomach because she has not eaten since this morning.  Patient denies any other symptoms at this time.  Patient was advised we are awaiting CT scan.   At time of end of shift patient  care was transferred to Dr. Ula pending CT results. If CT results are negative patient will be discharged with pantoprazole and advised to follow up with PCP.   Portions of this note were generated with Scientist, clinical (histocompatibility and immunogenetics). Dictation errors may occur despite best attempts at proofreading.    Final diagnoses:  None    ED Discharge Orders     None          Myriam Fonda GORMAN DEVONNA 05/04/24 2230    Ula Prentice SAUNDERS, MD 05/04/24 [phone]

## 2024-05-04 NOTE — ED Notes (Addendum)
 Assuming pt care, pt walk in for epigastric pain that radiates to back RT side x 1 day, 1 episode of vomiting toda, d/c 2 weeks ago for same reason, pt aaox4, spanish speaker, pt denies med hx., mae, skin warm/dry. Assessed by ER PA, family at bedside, call bell within reach.

## 2024-05-05 ENCOUNTER — Other Ambulatory Visit (HOSPITAL_COMMUNITY): Payer: Self-pay

## 2024-05-05 ENCOUNTER — Other Ambulatory Visit: Payer: Self-pay

## 2024-05-05 MED ORDER — PANTOPRAZOLE SODIUM 20 MG PO TBEC
40.0000 mg | DELAYED_RELEASE_TABLET | Freq: Every day | ORAL | 0 refills | Status: DC
  Filled 2024-05-05 (×4): qty 60, 30d supply, fill #0

## 2024-05-07 ENCOUNTER — Other Ambulatory Visit (HOSPITAL_COMMUNITY): Payer: Self-pay

## 2024-05-11 ENCOUNTER — Ambulatory Visit: Payer: Self-pay | Admitting: Gastroenterology

## 2024-05-11 ENCOUNTER — Other Ambulatory Visit (INDEPENDENT_AMBULATORY_CARE_PROVIDER_SITE_OTHER): Payer: Self-pay

## 2024-05-11 ENCOUNTER — Encounter: Payer: Self-pay | Admitting: Gastroenterology

## 2024-05-11 VITALS — BP 102/60 | HR 75 | Ht 61.0 in | Wt 180.5 lb

## 2024-05-11 DIAGNOSIS — R1013 Epigastric pain: Secondary | ICD-10-CM

## 2024-05-11 DIAGNOSIS — R110 Nausea: Secondary | ICD-10-CM

## 2024-05-11 DIAGNOSIS — K8020 Calculus of gallbladder without cholecystitis without obstruction: Secondary | ICD-10-CM

## 2024-05-11 DIAGNOSIS — R7401 Elevation of levels of liver transaminase levels: Secondary | ICD-10-CM

## 2024-05-11 LAB — HEPATIC FUNCTION PANEL
ALT: 125 U/L — ABNORMAL HIGH (ref 0–35)
AST: 41 U/L — ABNORMAL HIGH (ref 0–37)
Albumin: 4.4 g/dL (ref 3.5–5.2)
Alkaline Phosphatase: 122 U/L — ABNORMAL HIGH (ref 39–117)
Bilirubin, Direct: 0.1 mg/dL (ref 0.0–0.3)
Total Bilirubin: 0.4 mg/dL (ref 0.2–1.2)
Total Protein: 7.8 g/dL (ref 6.0–8.3)

## 2024-05-11 NOTE — Patient Instructions (Addendum)
 _______________________________________________________  If your blood pressure at your visit was 140/90 or greater, please contact your primary care physician to follow up on this.  _______________________________________________________  If you are age 34 or older, your body mass index should be between 23-30. Your Body mass index is 34.11 kg/m. If this is out of the aforementioned range listed, please consider follow up with your Primary Care Provider.  If you are age 74 or younger, your body mass index should be between 19-25. Your Body mass index is 34.11 kg/m. If this is out of the aformentioned range listed, please consider follow up with your Primary Care Provider.   ________________________________________________________  The [place] [facility] providers would like to encourage you to use MYCHART to communicate with providers for non-urgent requests or questions.  Due to long hold times on the telephone, sending your provider a message by [facility] may be a faster and more efficient way to get a response.  Please allow 48 business hours for a response.  Please remember that this is for non-urgent requests.  _______________________________________________________  [facility] is using a team-based approach to care.  Your team is made up of your doctor and two to three APPS. Our APPS (Nurse Practitioners and Physician Assistants) work with your physician to ensure care continuity for you. They are fully qualified to address your health concerns and develop a treatment plan. They communicate directly with your gastroenterologist to care for you. Seeing the Advanced Practice Practitioners on your physician's team can help you by facilitating care more promptly, often allowing for earlier appointments, access to diagnostic testing, procedures, and other specialty referrals.   Your provider has requested that you go to the basement level for lab work before leaving today. Press B on the  elevator. The lab is located at the first door on the left as you exit the elevator.  You have been scheduled for an endoscopy. Please follow written instructions given to you at your visit today.  If you use inhalers (even only as needed), please bring them with you on the day of your procedure.  If you take any of the following medications, they will need to be adjusted prior to your procedure:   DO NOT TAKE 7 DAYS PRIOR TO TEST- Trulicity (dulaglutide) Ozempic, Wegovy (semaglutide) Mounjaro, Zepbound (tirzepatide) Bydureon Bcise (exanatide extended release)  DO NOT TAKE 1 DAY PRIOR TO YOUR TEST Rybelsus (semaglutide) Adlyxin (lixisenatide) Victoza (liraglutide) Byetta (exanatide) ___________________________________________________________________________  Due to recent changes in healthcare laws, you may see the results of your imaging and laboratory studies on MyChart before your provider has had a chance to review them.  We understand that in some cases there may be results that are confusing or concerning to you. Not all laboratory results come back in the same time frame and the provider may be waiting for multiple results in order to interpret others.  Please give us  48 hours in order for your provider to thoroughly review all the results before contacting the office for clarification of your results.   It was a pleasure to see you today!  Vito Cirigliano, D.O.

## 2024-05-11 NOTE — Progress Notes (Signed)
 Chief Complaint: Epigastric pain, elevated liver enzymes   Referring Provider:     Fonda Siva, PA-C (ER)   HPI:     Colleen Coleman is a 33 y.o. female with medical history as below referred to the Gastroenterology Clinic for evaluation of epigastric pain and elevated liver enzymes.  Has been having mild, intermittent pain for 1 year, approximately 3 times/week.  Described as epigastric pain, radiating to the back and off to the right scapula. +nausea, and 1 episode of emesis. No diarrhea, hematochezia, melena. No documented fever. Sxs worse over the last few weeks prompting her to go to ER x2 as below. Can wake her from sleep.   Last episode was 2 days ago (mild).   She was seen in the ER for these symptoms on 04/20/2024 with evaluation notable for the following: - BG 115, otherwise normal BMP - Normal liver enzymes including AST/ALT 26/30, ALP 79, T. bili 0.3 - Normal CBC, lipase, UA, troponin - CXR: Normal  She was again seen in the ER for these symptoms on 05/04/2024 with evaluation notable for the following: - AST/ALT 132/99, ALP 95, T. bili 0.7.  Otherwise normal BMP - Normal CBC, lipase, UA, hCG - CT A/P: Cholelithiasis without cholecystitis, 1 mm nonobstructing right renal stone.  Normal-appearing GI tract.  No duct dilation, normal-appearing liver, pancreas - Symptoms resolved and was discharged home with Carafate and Protonix  No prior history of elevated liver enzymes or known hepatobiliary disease.   No previous EGD or colonoscopy.  No known family history of CRC, GI malignancy, liver disease, pancreatic disease, or IBD.   History obtained via interpretor in the room today,    Past Medical History:  Diagnosis Date   Cholelithiasis complicating pregnancy in third trimester, antepartum    Cholestasis of pregnancy in third trimester 02/19/2016   Depression    Gestational diabetes    with 3rd pregnancy   Hx of chlamydia infection     Hyperemesis arising during pregnancy    Obesity (BMI 30.0-34.9)    Supervision of high risk pregnancy, antepartum 01/12/2016    Clinic  [facility] Prenatal Labs Dating  Blood type: A/Positive/-- (03/27 0000) A+ Genetic Screen 1 Screen:    AFP:     Quad:     NIPS: Antibody:Negative (03/27 0000) Anatomic US  At HD nl Rubella: Immune (03/27 0000) GTT Early:               Third trimester:  RPR: Nonreactive (06/19 0000)  Flu vaccine  09/2015) HBsAg: Negative (03/27 0000)  TDaP vaccine     Re eived                                      Varicose veins    back of both legs     Past Surgical History:  Procedure Laterality Date   NO PAST SURGERIES     Family History  Problem Relation Age of Onset   Other Mother        varicosities   Hypertension Sister    Other Sister        varicosities   Depression Sister    Social History   Tobacco Use   Smoking status: Never   Smokeless tobacco: Never  Vaping Use   Vaping status: Never Used  Substance Use Topics   Alcohol use:  No   Drug use: No   Current Outpatient Medications  Medication Sig Dispense Refill   acetaminophen  (TYLENOL ) 325 MG tablet Take 325 mg by mouth every 6 (six) hours as needed (pain). Reported on 01/15/2016 (Patient not taking: Reported on 07/29/2021)     cyclobenzaprine  (FLEXERIL ) 10 MG tablet Take 1 tablet (10 mg total) by mouth 3 (three) times daily as needed for muscle spasms. (Patient not taking: Reported on 07/15/2021) 30 tablet 2   etonogestrel-ethinyl estradiol (NUVARING) 0.12-0.015 MG/24HR vaginal ring Place 1 each vaginally every 28 (twenty-eight) days. Insert vaginally and leave in place for 3 consecutive weeks, then remove for 1 week.     norethindrone  (MICRONOR ) 0.35 MG tablet Take 1 tablet (0.35 mg total) by mouth daily. (Patient not taking: Reported on 07/01/2021) 1 Package 11   ondansetron  (ZOFRAN -ODT) 4 MG disintegrating tablet Take 1 tablet (4 mg total) by mouth every 8 (eight) hours as needed for nausea or vomiting. 20  tablet 0   oxyCODONE -acetaminophen  (PERCOCET/ROXICET) 5-325 MG tablet Take 1 tablet by mouth every 6 (six) hours as needed for severe pain (pain score 7-10). 10 tablet 0   pantoprazole (PROTONIX) 20 MG tablet Take 1 tablet (20 mg total) by mouth daily. 30 tablet 0   pantoprazole (PROTONIX) 20 MG tablet Take 2 tablets (40 mg total) by mouth daily. 60 tablet 0   Prenatal Vit-Fe Fumarate-FA (PRENATAL MULTIVITAMIN) TABS tablet Take 1 tablet by mouth daily at 12 noon. (Patient not taking: Reported on 07/15/2021)     sucralfate (CARAFATE) 1 GM/10ML suspension Take 10 mLs (1 g total) by mouth 4 (four) times daily -  with meals and at bedtime. 420 mL 0   No current facility-administered medications for this visit.   Allergies  Allergen Reactions   Penicillins Hives    Has patient had a PCN reaction causing immediate rash, facial/tongue/throat swelling, SOB or lightheadedness with hypotension: unknown, childhood Has patient had a PCN reaction causing severe rash involving mucus membranes or skin necrosis:  Has patient had a PCN reaction that required hospitalization  Has patient had a PCN reaction occurring within the last 10 years  If all of the above answers are NO, then may proceed with Cephalosporin use.    Shrimp [Shellfish Allergy] Hives     Review of Systems: All systems reviewed and negative except where noted in HPI.     Physical Exam:    Wt Readings from Last 3 Encounters:  05/04/24 178 lb (80.7 kg)  08/26/21 170 lb (77.1 kg)  07/29/21 169 lb 6.4 oz (76.8 kg)    LMP 04/23/2024 (Approximate)  Constitutional:  Pleasant, in no acute distress. Psychiatric: Normal mood and affect. Behavior is normal. Cardiovascular: Normal rate, regular rhythm. No edema Pulmonary/chest: Effort normal and breath sounds normal. No wheezing, rales or rhonchi. Abdominal: Soft, nondistended, nontender. Bowel sounds active throughout. There are no masses palpable. No hepatomegaly. Neurological: Alert  and oriented to person place and time. Skin: Skin is warm and dry. No rashes noted.   ASSESSMENT AND PLAN;   1) Epigastric pain 2) Nausea - Plan for expedited EGD to evaluate for PUD, H. pylori gastritis - EGD scheduled for 11/10 at 7:30 AM - If EGD unremarkable, plan for HIDA scan  3) Elevated liver enzymes 4) Cholelithiasis No prior history of elevated liver enzymes, and enzymes were normal on initial ER evaluation on 10/17.  On repeat ER evaluation on 10/31 AST/ALT elevated 132/99 with otherwise normal ALP and T. bili. - Repeat liver enzymes  today.  If still uptrending, plan for extended serologic workup - Other than cholelithiasis, CT was largely unremarkable from a hepatobiliary standpoint with normal-appearing liver and no duct dilation. - Consideration for HIDA scan as above - Depending on repeat liver enzymes, may also consider RUQ US  to further evaluate gallbladder and CBD  The indications, risks, and benefits of EGD were explained to the patient in detail. Risks include but are not limited to bleeding, perforation, adverse reaction to medications, and cardiopulmonary compromise. Sequelae include but are not limited to the possibility of surgery, hospitalization, and mortality. The patient verbalized understanding and wished to proceed. All questions answered, referred to scheduler. Further recommendations pending results of the exam.       Sandor GAILS Khrystal Jeanmarie, DO, FACG  05/11/2024, 2:02 PM   No ref. provider found

## 2024-05-14 ENCOUNTER — Ambulatory Visit: Payer: Self-pay | Admitting: Gastroenterology

## 2024-05-14 ENCOUNTER — Other Ambulatory Visit: Payer: Self-pay

## 2024-05-14 ENCOUNTER — Encounter: Payer: Self-pay | Admitting: Gastroenterology

## 2024-05-14 VITALS — BP 108/76 | HR 58 | Temp 98.8°F | Resp 14 | Ht 61.0 in | Wt 180.0 lb

## 2024-05-14 DIAGNOSIS — R110 Nausea: Secondary | ICD-10-CM

## 2024-05-14 DIAGNOSIS — K2950 Unspecified chronic gastritis without bleeding: Secondary | ICD-10-CM

## 2024-05-14 DIAGNOSIS — K2970 Gastritis, unspecified, without bleeding: Secondary | ICD-10-CM

## 2024-05-14 DIAGNOSIS — R1013 Epigastric pain: Secondary | ICD-10-CM

## 2024-05-14 DIAGNOSIS — R7401 Elevation of levels of liver transaminase levels: Secondary | ICD-10-CM

## 2024-05-14 DIAGNOSIS — K3189 Other diseases of stomach and duodenum: Secondary | ICD-10-CM

## 2024-05-14 MED ORDER — SODIUM CHLORIDE 0.9 % IV SOLN: 500.0000 mL | Freq: Once | INTRAVENOUS | Status: DC

## 2024-05-14 NOTE — Progress Notes (Signed)
 Pt's states no medical or surgical changes since previsit or office visit.

## 2024-05-14 NOTE — Patient Instructions (Signed)
 Handouts given: Gastritis Resume previous diet. Continue present medications.  Await pathology results. Resume pantoprazole 20 mg twice a day for 6 weeks, then can reduce to daily dose.  USTED TUVO UN PROCEDIMIENTO ENDOSCPICO HOY EN EL [facility]:   Lea el informe del procedimiento que se le entreg para cualquier pregunta especfica sobre lo que se dentist.  Si el informe del examen no responde a sus preguntas, por favor llame a su gastroenterlogo para aclararlo.  Si usted solicit que no se le den lowe's companies de lo que se clinical cytogeneticist en su procedimiento al marathon oil va a cuidar, entonces el informe del procedimiento se ha incluido en un sobre sellado para que usted lo revise despus cuando le sea ms conveniente.   LO QUE PUEDE ESPERAR: Algunas sensaciones de hinchazn en el abdomen.  Puede tener ms gases de lo normal.  El caminar puede ayudarle a eliminar el aire que se le puso en el tracto gastrointestinal durante el procedimiento y reducir la hinchazn.  Si le hicieron una endoscopia inferior (como una colonoscopia o una sigmoidoscopia flexible), podra notar manchas de sangre en las heces fecales o en el papel higinico.  Si se someti a una preparacin intestinal para su procedimiento, es posible que no tenga una evacuacin intestinal normal durante time warner.   Tenga en cuenta:  Es posible que note un poco de irritacin y congestin en la nariz o algn drenaje.  Esto es debido al oxgeno applied materials durante su procedimiento.  No hay que preocuparse y esto debe desaparecer ms o regulatory affairs officer.   Despus de la endoscopia superior (EGD)  Vmitos de retail buyer o material como caf molido   Dolor en el pecho o dolor debajo de los omplatos que antes no tena   Dolor o dificultad persistente para tragar  Falta de aire que antes no tena   Fiebre de 100F o ms  Heces fecales negras y pegajosas   Para asuntos urgentes o de associate professor, puede comunicarse con  un gastroenterlogo a cualquier hora llamando al [phone].  DIETA:  Recomendamos una comida pequea al principio, pero luego puede continuar con su dieta normal.  Tome muchos lquidos, pero debe evitar las bebidas alcohlicas durante 24 horas.    ACTIVIDAD:  Debe planear tomarse las cosas con calma por el resto del da y no debe CONDUCIR ni usar maquinaria pesada patent examiner (debido a los medicamentos de sedacin utilizados durante el examen).     SEGUIMIENTO: Nuestro personal llamar al nmero que aparece en su historial al siguiente da hbil de su procedimiento para ver cmo se siente y para responder cualquier pregunta o inquietud que pueda tener con respecto a la informacin que se le dio despus del procedimiento. Si no podemos contactarle, le dejaremos un mensaje.  Sin embargo, si se siente bien y no tiene english as a second language teacher, no es necesario que nos devuelva la llamada.  Asumiremos que ha regresado a sus actividades diarias normales sin incidentes. Si se le tomaron algunas biopsias, le contactaremos por telfono o por carta en las prximas 3 semanas.  Si no ha sabido [employer] biopsias en el transcurso de 3 semanas, por favor llmenos al [phone].   FIRMAS/CONFIDENCIALIDAD: Usted y/o el acompaante que le cuide han firmado documentos que se ingresarn en su historial mdico electrnico.  Estas firmas atestiguan el hecho de que la informacin anterior

## 2024-05-14 NOTE — Progress Notes (Signed)
 Report given to PACU, vss

## 2024-05-14 NOTE — Op Note (Signed)
 [facility] Patient Name: Colleen Coleman Procedure Date: 05/14/2024 8:29 AM MRN: [id number] Endoscopist: Sandor Flatter , MD, [id number] Age: 33 Referring MD:  Date of Birth: July 10, 1990 Gender: Female Account #: [id number] Procedure:                Upper GI endoscopy Indications:              Epigastric abdominal pain, Nausea Medicines:                Monitored Anesthesia Care Procedure:                Pre-Anesthesia Assessment:                           - Prior to the procedure, a History and Physical                            was performed, and patient medications and                            allergies were reviewed. The patient's tolerance of                            previous anesthesia was also reviewed. The risks                            and benefits of the procedure and the sedation                            options and risks were discussed with the patient.                            All questions were answered, and informed consent                            was obtained. Prior Anticoagulants: The patient has                            taken no anticoagulant or antiplatelet agents. ASA                            Grade Assessment: II - A patient with mild systemic                            disease. After reviewing the risks and benefits,                            the patient was deemed in satisfactory condition to                            undergo the procedure.                           After obtaining informed consent, the endoscope was  passed under direct vision. Throughout the                            procedure, the patient's blood pressure, pulse, and                            oxygen saturations were monitored continuously. The                            Olympus scope [id number] was introduced through the                            mouth, and advanced to the second part of duodenum.                            The  upper GI endoscopy was accomplished without                            difficulty. The patient tolerated the procedure                            well. Scope In: Scope Out: Findings:                 The examined esophagus was normal.                           The Z-line was regular and was found 33 cm from the                            incisors.                           The gastroesophageal flap valve was visualized                            endoscopically and classified as Hill Grade III                            (minimal fold, loose to endoscope, hiatal hernia                            likely).                           Scattered mild inflammation characterized by                            congestion (edema) and erythema was found in the                            gastric fundus and in the gastric body. Biopsies                            were taken with a cold forceps for Helicobacter  pylori testing. Estimated blood loss was minimal.                           The examined duodenum was normal. Biopsies were                            taken with a cold forceps for histology. Estimated                            blood loss was minimal. Complications:            No immediate complications. Estimated Blood Loss:     Estimated blood loss was minimal. Impression:               - Normal esophagus.                           - Z-line regular, 33 cm from the incisors.                           - Gastroesophageal flap valve classified as Hill                            Grade III (minimal fold, loose to endoscope, hiatal                            hernia likely).                           - Gastritis. Biopsied.                           - Normal examined duodenum. Biopsied. Recommendation:           - Patient has a contact number available for                            emergencies. The signs and symptoms of potential                            delayed  complications were discussed with the                            patient. Return to normal activities tomorrow.                            Written discharge instructions were provided to the                            patient.                           - Resume previous diet.                           - Continue present medications.                           -  Await pathology results.                           - Resume pantoprazole 20 mg BID as recently                            prescribed for 6 weeks, then can reduce to daily                            dosing. Sandor Flatter, MD 05/14/2024 8:57:40 AM

## 2024-05-14 NOTE — Progress Notes (Signed)
0839 Robinul 0.1 mg IV given due large amount of secretions upon assessment.  MD made aware, vss 

## 2024-05-14 NOTE — Addendum Note (Signed)
 Addended by: BENJAMINE NAT DEL on: 05/14/2024 01:58 PM   Modules accepted: Orders

## 2024-05-14 NOTE — Progress Notes (Signed)
 GASTROENTEROLOGY PROCEDURE H&P NOTE   Primary Care Physician: Patient, No Pcp Per    Reason for Procedure:   Epigastric pain  Plan:    EGD  Patient is appropriate for endoscopic procedure(s) in the ambulatory ([facility]) setting.  The nature of the procedure, as well as the risks, benefits, and alternatives were carefully and thoroughly reviewed with the patient with interpretor present. Ample time for discussion and questions allowed. The patient understood, was satisfied, and agreed to proceed. I personally addressed all patient questions and concerns.     HPI: Colleen Coleman is a 33 y.o. female who presents for EGD for evaluation of epigastric pain, nausea, cholelithiasis.  Patient was most recently seen in the Gastroenterology Clinic on 05/11/2024.  No interval change in medical history since that appointment. Please refer to that note for full details regarding GI history and clinical presentation.   Past Medical History:  Diagnosis Date   Cholelithiasis complicating pregnancy in third trimester, antepartum    Cholestasis of pregnancy in third trimester 02/19/2016   Depression    Gestational diabetes    with 3rd pregnancy   Hx of chlamydia infection    Hyperemesis arising during pregnancy    Obesity (BMI 30.0-34.9)    Supervision of high risk pregnancy, antepartum 01/12/2016    Clinic  [facility] Prenatal Labs Dating  Blood type: A/Positive/-- (03/27 0000) A+ Genetic Screen 1 Screen:    AFP:     Quad:     NIPS: Antibody:Negative (03/27 0000) Anatomic US  At HD nl Rubella: Immune (03/27 0000) GTT Early:               Third trimester:  RPR: Nonreactive (06/19 0000)  Flu vaccine  09/2015) HBsAg: Negative (03/27 0000)  TDaP vaccine     Re eived                                      Varicose veins    back of both legs    Past Surgical History:  Procedure Laterality Date   NO PAST SURGERIES      Prior to Admission medications   Medication Sig Start Date End Date Taking?  Authorizing Provider  cyclobenzaprine  (FLEXERIL ) 10 MG tablet Take 1 tablet (10 mg total) by mouth 3 (three) times daily as needed for muscle spasms. Patient not taking: Reported on 05/11/2024 10/05/18   Stinson, Jacob J, DO  etonogestrel-ethinyl estradiol (NUVARING) 0.12-0.015 MG/24HR vaginal ring Place 1 each vaginally every 28 (twenty-eight) days. Insert vaginally and leave in place for 3 consecutive weeks, then remove for 1 week.    [provider]  norethindrone  (MICRONOR ) 0.35 MG tablet Take 1 tablet (0.35 mg total) by mouth daily. Patient not taking: Reported on 05/11/2024 03/20/19   Sid Veva CROME, NP  ondansetron  (ZOFRAN -ODT) 4 MG disintegrating tablet Take 1 tablet (4 mg total) by mouth every 8 (eight) hours as needed for nausea or vomiting. 05/04/24   Ula Prentice SAUNDERS, MD  oxyCODONE -acetaminophen  (PERCOCET/ROXICET) 5-325 MG tablet Take 1 tablet by mouth every 6 (six) hours as needed for severe pain (pain score 7-10). 05/04/24   Ula Prentice SAUNDERS, MD  pantoprazole (PROTONIX) 20 MG tablet Take 1 tablet (20 mg total) by mouth daily. Patient taking differently: Take 20 mg by mouth 2 (two) times daily. 05/04/24   Myriam Fonda RAMAN, PA-C  sucralfate (CARAFATE) 1 GM/10ML suspension Take 10 mLs (1 g total)  by mouth 4 (four) times daily -  with meals and at bedtime. 05/04/24   Ula Prentice SAUNDERS, MD    Current Outpatient Medications  Medication Sig Dispense Refill   cyclobenzaprine  (FLEXERIL ) 10 MG tablet Take 1 tablet (10 mg total) by mouth 3 (three) times daily as needed for muscle spasms. (Patient not taking: Reported on 05/11/2024) 30 tablet 2   etonogestrel-ethinyl estradiol (NUVARING) 0.12-0.015 MG/24HR vaginal ring Place 1 each vaginally every 28 (twenty-eight) days. Insert vaginally and leave in place for 3 consecutive weeks, then remove for 1 week.     norethindrone  (MICRONOR ) 0.35 MG tablet Take 1 tablet (0.35 mg total) by mouth daily. (Patient not taking: Reported on 05/11/2024) 1 Package 11    ondansetron  (ZOFRAN -ODT) 4 MG disintegrating tablet Take 1 tablet (4 mg total) by mouth every 8 (eight) hours as needed for nausea or vomiting. 20 tablet 0   oxyCODONE -acetaminophen  (PERCOCET/ROXICET) 5-325 MG tablet Take 1 tablet by mouth every 6 (six) hours as needed for severe pain (pain score 7-10). 10 tablet 0   pantoprazole (PROTONIX) 20 MG tablet Take 1 tablet (20 mg total) by mouth daily. (Patient taking differently: Take 20 mg by mouth 2 (two) times daily.) 30 tablet 0   sucralfate (CARAFATE) 1 GM/10ML suspension Take 10 mLs (1 g total) by mouth 4 (four) times daily -  with meals and at bedtime. 420 mL 0   Current Facility-Administered Medications  Medication Dose Route Frequency Provider Last Rate Last Admin   0.9 %  sodium chloride  infusion  500 mL Intravenous Once Legaci Tarman V, DO        Allergies as of 05/14/2024 - Review Complete 05/11/2024  Allergen Reaction Noted   Penicillins Hives 01/12/2016   Shrimp [shellfish allergy] Hives 09/26/2015    Family History  Problem Relation Age of Onset   Other Mother        varicosities   Hypertension Sister    Other Sister        varicosities   Depression Sister     Social History   Socioeconomic History   Marital status: Married    Spouse name: Not on file   Number of children: 4   Years of education: Not on file   Highest education level: 7th grade  Occupational History   Not on file  Tobacco Use   Smoking status: Never   Smokeless tobacco: Never  Vaping Use   Vaping status: Never Used  Substance and Sexual Activity   Alcohol use: No   Drug use: No   Sexual activity: Yes    Birth control/protection: Condom, Inserts    Comment: Nuvaring  Other Topics Concern   Not on file  Social History Narrative   Not on file   Social Drivers of Health   Financial Resource Strain: Not on file  Food Insecurity: No Food Insecurity (06/16/2021)   Hunger Vital Sign    Worried About Running Out of Food in the Last Year:  Never true    Ran Out of Food in the Last Year: Never true  Transportation Needs: No Transportation Needs (06/16/2021)   PRAPARE - Administrator, Civil Service (Medical): No    Lack of Transportation (Non-Medical): No  Physical Activity: Not on file  Stress: Not on file  Social Connections: Not on file  Intimate Partner Violence: Not on file    Physical Exam: Vital signs in last 24 hours: @Temp  98.8 F (37.1 C)   LMP 04/23/2024 (Approximate)  GEN: NAD EYE: Sclerae anicteric ENT: MMM CV: Non-tachycardic Pulm: CTA b/l GI: Soft, NT/ND NEURO:  Alert & Oriented x 3   Sandor Flatter, DO [facility]   05/14/2024 8:01 AM

## 2024-05-14 NOTE — Progress Notes (Signed)
 Called to room to assist during endoscopic procedure.  Patient ID and intended procedure confirmed with present staff. Received instructions for my participation in the procedure from the performing physician.

## 2024-05-15 ENCOUNTER — Telehealth: Payer: Self-pay

## 2024-05-15 NOTE — Telephone Encounter (Signed)
 RN attempted to call patient. Voicemail left in Spanish.

## 2024-05-18 LAB — SURGICAL PATHOLOGY

## 2024-05-23 ENCOUNTER — Ambulatory Visit: Payer: Self-pay | Admitting: Gastroenterology

## 2024-05-24 ENCOUNTER — Ambulatory Visit (HOSPITAL_COMMUNITY)
Admission: RE | Admit: 2024-05-24 | Discharge: 2024-05-24 | Disposition: A | Discharge status: Home / Self Care | Payer: Self-pay | Source: Ambulatory Visit | Attending: Gastroenterology | Admitting: Gastroenterology

## 2024-05-24 ENCOUNTER — Other Ambulatory Visit (HOSPITAL_COMMUNITY): Payer: Self-pay

## 2024-05-24 ENCOUNTER — Other Ambulatory Visit: Payer: Self-pay

## 2024-05-24 ENCOUNTER — Encounter: Payer: Self-pay | Admitting: Gastroenterology

## 2024-05-24 DIAGNOSIS — R1013 Epigastric pain: Secondary | ICD-10-CM | POA: Insufficient documentation

## 2024-05-24 DIAGNOSIS — R110 Nausea: Secondary | ICD-10-CM | POA: Insufficient documentation

## 2024-05-24 DIAGNOSIS — R7401 Elevation of levels of liver transaminase levels: Secondary | ICD-10-CM | POA: Insufficient documentation

## 2024-05-24 MED ORDER — PEPTO-BISMOL 262 MG PO TABS
2.0000 | ORAL_TABLET | Freq: Four times a day (QID) | ORAL | 0 refills | Status: AC
  Filled 2024-05-24: qty 112, 14d supply, fill #0

## 2024-05-24 MED ORDER — METRONIDAZOLE 250 MG PO TABS
250.0000 mg | ORAL_TABLET | Freq: Four times a day (QID) | ORAL | 0 refills | Status: AC
  Filled 2024-05-24: qty 56, 14d supply, fill #0

## 2024-05-24 MED ORDER — DOXYCYCLINE HYCLATE 100 MG PO CAPS
100.0000 mg | ORAL_CAPSULE | Freq: Two times a day (BID) | ORAL | 0 refills | Status: AC
  Filled 2024-05-24: qty 28, 14d supply, fill #0

## 2024-05-24 NOTE — Telephone Encounter (Signed)
 Prescriptions sent to pharmacy on file. Diatherix reminder in epic for 6 weeks from now (07/05/2024). Patient notified of results and recommendations via MyChart.

## 2024-05-25 ENCOUNTER — Ambulatory Visit: Payer: Self-pay | Admitting: Gastroenterology

## 2024-07-06 ENCOUNTER — Telehealth: Payer: Self-pay

## 2024-07-06 DIAGNOSIS — K2970 Gastritis, unspecified, without bleeding: Secondary | ICD-10-CM

## 2024-07-06 NOTE — Telephone Encounter (Signed)
-----   Message from Nurse Cristino NOVAK, RN sent at 05/24/2024  9:29 AM EST ----- Regarding: 4-week stool reminder - H. Pylori [employer]atherix H. Pylori due - need to order - Dr. San   Dx: H. Pylori gastritis

## 2024-07-06 NOTE — Telephone Encounter (Addendum)
 Use Translator [phone] but patient's voicebox is full so couldn't leave voicemail  Kit placed on second floor for patient to pick up. She will need to be called again  Instructions in spanish and placed at front on second floor with name on it under the letter L

## 2024-07-11 NOTE — Telephone Encounter (Signed)
 Left voicemail using Interpreter Service Line [facility] #[phone]) asking patient to return call to our office.  Also, sent patient a MyChart message requesting that she complete Diatherix stool kit.

## 2024-07-18 ENCOUNTER — Telehealth: Payer: Self-pay | Admitting: Gastroenterology

## 2024-07-18 NOTE — Telephone Encounter (Signed)
 Good news, H. pylori [employer]atherix panel was negative indicating successful eradication with recent antibiotics.  No additional testing needed at this time.

## 2024-07-18 NOTE — Telephone Encounter (Addendum)
 Interpreter [phone] hung up the phone on me  So [phone] Colleen Coleman helped and patient was made aware and voiced understanding

## 2024-07-25 ENCOUNTER — Encounter: Payer: Self-pay | Admitting: Gastroenterology
